# Patient Record
Sex: Female | Born: 1995 | Race: Black or African American | Hispanic: No | Marital: Single | Smoking: Current some day smoker
Health system: Southern US, Community
[De-identification: ages and names within clinical notes are randomized; demographics above are authoritative.]

## PROBLEM LIST (undated history)

## (undated) DIAGNOSIS — Z789 Other specified health status: Secondary | ICD-10-CM

## (undated) HISTORY — PX: NO PAST SURGERIES: SHX2092

---
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
## 2016-06-06 ENCOUNTER — Emergency Department (HOSPITAL_COMMUNITY): Payer: No Typology Code available for payment source

## 2016-06-06 ENCOUNTER — Emergency Department (HOSPITAL_COMMUNITY)
Admission: EM | Admit: 2016-06-06 | Discharge: 2016-06-06 | Disposition: A | Discharge status: Home / Self Care | Payer: No Typology Code available for payment source | Attending: Emergency Medicine | Admitting: Emergency Medicine

## 2016-06-06 ENCOUNTER — Encounter (HOSPITAL_COMMUNITY): Payer: Self-pay

## 2016-06-06 DIAGNOSIS — S8002XA Contusion of left knee, initial encounter: Secondary | ICD-10-CM | POA: Diagnosis not present

## 2016-06-06 DIAGNOSIS — S0083XA Contusion of other part of head, initial encounter: Secondary | ICD-10-CM | POA: Insufficient documentation

## 2016-06-06 DIAGNOSIS — F17200 Nicotine dependence, unspecified, uncomplicated: Secondary | ICD-10-CM | POA: Insufficient documentation

## 2016-06-06 DIAGNOSIS — S161XXA Strain of muscle, fascia and tendon at neck level, initial encounter: Secondary | ICD-10-CM

## 2016-06-06 DIAGNOSIS — Y999 Unspecified external cause status: Secondary | ICD-10-CM | POA: Diagnosis not present

## 2016-06-06 DIAGNOSIS — S0993XA Unspecified injury of face, initial encounter: Secondary | ICD-10-CM | POA: Diagnosis present

## 2016-06-06 DIAGNOSIS — Y939 Activity, unspecified: Secondary | ICD-10-CM | POA: Insufficient documentation

## 2016-06-06 DIAGNOSIS — Y92410 Unspecified street and highway as the place of occurrence of the external cause: Secondary | ICD-10-CM | POA: Diagnosis not present

## 2016-06-06 MED ORDER — NAPROXEN 500 MG PO TABS: 500.0000 mg | ORAL_TABLET | Freq: Two times a day (BID) | ORAL | 0 refills | Status: DC

## 2016-06-06 MED ORDER — ACETAMINOPHEN 325 MG PO TABS
650.0000 mg | ORAL_TABLET | Freq: Once | ORAL | Status: AC
  Administered 2016-06-06: 650 mg via ORAL
  Filled 2016-06-06: qty 2

## 2016-06-06 MED ORDER — METHOCARBAMOL 500 MG PO TABS: 500.0000 mg | ORAL_TABLET | Freq: Two times a day (BID) | ORAL | 0 refills | Status: DC

## 2016-06-06 NOTE — Discharge Instructions (Signed)
Ice your knee in your jaw several times a day. Naproxen for pain. Robaxin for muscle spasms. Follow-up with primary care doctor.
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
## 2017-09-25 ENCOUNTER — Encounter (HOSPITAL_COMMUNITY): Payer: Self-pay

## 2017-09-25 ENCOUNTER — Emergency Department (HOSPITAL_COMMUNITY)
Admission: EM | Admit: 2017-09-25 | Discharge: 2017-09-25 | Disposition: A | Discharge status: Home / Self Care | Payer: Medicaid Other | Attending: Emergency Medicine | Admitting: Emergency Medicine

## 2017-09-25 ENCOUNTER — Emergency Department (HOSPITAL_COMMUNITY)
Admission: EM | Admit: 2017-09-25 | Discharge: 2017-09-25 | Disposition: A | Discharge status: Home / Self Care | Payer: Medicaid Other | Source: Home / Self Care | Attending: Emergency Medicine | Admitting: Emergency Medicine

## 2017-09-25 ENCOUNTER — Emergency Department (HOSPITAL_COMMUNITY): Payer: Medicaid Other

## 2017-09-25 ENCOUNTER — Encounter (HOSPITAL_COMMUNITY): Payer: Self-pay | Admitting: Emergency Medicine

## 2017-09-25 DIAGNOSIS — F17210 Nicotine dependence, cigarettes, uncomplicated: Secondary | ICD-10-CM | POA: Diagnosis not present

## 2017-09-25 DIAGNOSIS — Y999 Unspecified external cause status: Secondary | ICD-10-CM | POA: Insufficient documentation

## 2017-09-25 DIAGNOSIS — Y939 Activity, unspecified: Secondary | ICD-10-CM | POA: Insufficient documentation

## 2017-09-25 DIAGNOSIS — S022XXD Fracture of nasal bones, subsequent encounter for fracture with routine healing: Secondary | ICD-10-CM

## 2017-09-25 DIAGNOSIS — T07XXXA Unspecified multiple injuries, initial encounter: Secondary | ICD-10-CM | POA: Diagnosis present

## 2017-09-25 DIAGNOSIS — H1132 Conjunctival hemorrhage, left eye: Secondary | ICD-10-CM | POA: Diagnosis not present

## 2017-09-25 DIAGNOSIS — S022XXA Fracture of nasal bones, initial encounter for closed fracture: Secondary | ICD-10-CM | POA: Diagnosis not present

## 2017-09-25 DIAGNOSIS — Y929 Unspecified place or not applicable: Secondary | ICD-10-CM | POA: Diagnosis not present

## 2017-09-25 DIAGNOSIS — S0003XA Contusion of scalp, initial encounter: Secondary | ICD-10-CM | POA: Diagnosis not present

## 2017-09-25 DIAGNOSIS — S1091XA Abrasion of unspecified part of neck, initial encounter: Secondary | ICD-10-CM | POA: Insufficient documentation

## 2017-09-25 DIAGNOSIS — S0012XA Contusion of left eyelid and periocular area, initial encounter: Secondary | ICD-10-CM | POA: Diagnosis not present

## 2017-09-25 LAB — POC URINE PREG, ED: Preg Test, Ur: NEGATIVE

## 2017-09-25 MED ORDER — KETOROLAC TROMETHAMINE 60 MG/2ML IM SOLN
60.0000 mg | Freq: Once | INTRAMUSCULAR | Status: AC
  Administered 2017-09-25: 60 mg via INTRAMUSCULAR
  Filled 2017-09-25: qty 2

## 2017-09-25 MED ORDER — IBUPROFEN 800 MG PO TABS
800.0000 mg | ORAL_TABLET | Freq: Once | ORAL | Status: AC
  Administered 2017-09-25: 800 mg via ORAL
  Filled 2017-09-25: qty 1

## 2017-09-25 MED ORDER — HYDROCODONE-ACETAMINOPHEN 5-325 MG PO TABS: 1.0000 | ORAL_TABLET | Freq: Four times a day (QID) | ORAL | 0 refills | Status: DC | PRN

## 2017-09-25 MED ORDER — IBUPROFEN 600 MG PO TABS: 600.0000 mg | ORAL_TABLET | Freq: Four times a day (QID) | ORAL | 0 refills | Status: DC | PRN
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
## 2017-09-25 NOTE — ED Triage Notes (Signed)
Patient was discharged within last hour. Patient c/o that ibuprofen that she was given is not helping with her facial pain and wants something stronger.
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
## 2018-02-05 ENCOUNTER — Ambulatory Visit: Payer: Medicaid Other | Admitting: Obstetrics

## 2018-02-18 ENCOUNTER — Other Ambulatory Visit (HOSPITAL_COMMUNITY): Payer: Self-pay | Admitting: Family

## 2018-03-27 ENCOUNTER — Other Ambulatory Visit (HOSPITAL_COMMUNITY): Payer: Self-pay | Admitting: Family

## 2018-04-16 ENCOUNTER — Ambulatory Visit: Payer: Medicaid Other | Admitting: Family Medicine

## 2018-06-23 ENCOUNTER — Ambulatory Visit (HOSPITAL_COMMUNITY)
Admission: EM | Admit: 2018-06-23 | Discharge: 2018-06-23 | Disposition: A | Discharge status: Home / Self Care | Payer: Self-pay | Attending: Family Medicine | Admitting: Family Medicine

## 2018-06-23 ENCOUNTER — Encounter (HOSPITAL_COMMUNITY): Payer: Self-pay | Admitting: Emergency Medicine

## 2018-06-23 ENCOUNTER — Ambulatory Visit (INDEPENDENT_AMBULATORY_CARE_PROVIDER_SITE_OTHER): Payer: Self-pay

## 2018-06-23 DIAGNOSIS — W228XXA Striking against or struck by other objects, initial encounter: Secondary | ICD-10-CM

## 2018-06-23 DIAGNOSIS — S62625A Displaced fracture of medial phalanx of left ring finger, initial encounter for closed fracture: Secondary | ICD-10-CM

## 2018-06-23 DIAGNOSIS — S62655A Nondisplaced fracture of medial phalanx of left ring finger, initial encounter for closed fracture: Secondary | ICD-10-CM

## 2018-06-23 NOTE — ED Provider Notes (Signed)
[facility]    CSN: [id number] Arrival date & time: 06/23/18  1900     History   Chief Complaint Chief Complaint  Patient presents with  . Finger Injury    HPI Brianna Donaldson is a 22 y.o. female. Pt hit finger; does not really provide more detail.  Has been swollen and painful since  HPI  History reviewed. No pertinent past medical history.  There are no active problems to display for this patient.   History reviewed. No pertinent surgical history.  OB History    Gravida  0   Para  0   Term  0   Preterm  0   AB  0   Living  0     SAB  0   TAB  0   Ectopic  0   Multiple  0   Live Births               Home Medications    Prior to Admission medications   Medication Sig Start Date End Date Taking? Authorizing Provider  HYDROcodone-acetaminophen (NORCO/VICODIN) 5-325 MG tablet Take 1 tablet by mouth every 6 (six) hours as needed for moderate pain. Patient not taking: Reported on 06/23/2018 09/25/17   Donnetta Hutching, MD  ibuprofen (ADVIL,MOTRIN) 600 MG tablet Take 1 tablet (600 mg total) by mouth every 6 (six) hours as needed for headache, mild pain or moderate pain. 09/25/17   Antony Madura, PA-C    Family History Family History  Problem Relation Age of Onset  . Diabetes Maternal Grandmother     Social History Social History   Tobacco Use  . Smoking status: Current Every Day Smoker  . Smokeless tobacco: Never Used  Substance Use Topics  . Alcohol use: No  . Drug use: Yes    Types: Marijuana     Allergies   Patient has no known allergies.   Review of Systems Review of Systems  Constitutional: Negative for chills and fever.  HENT: Negative for ear pain and sore throat.   Eyes: Negative for pain and visual disturbance.  Respiratory: Negative for cough and shortness of breath.   Cardiovascular: Negative for chest pain and palpitations.  Gastrointestinal: Negative for abdominal pain and vomiting.  Genitourinary: Negative  for dysuria and hematuria.  Musculoskeletal: Positive for arthralgias. Negative for back pain.  Skin: Negative for color change and rash.  Neurological: Negative for seizures and syncope.  All other systems reviewed and are negative.    Physical Exam Triage Vital Signs ED Triage Vitals  Enc Vitals Group     BP 06/23/18 2012 103/69     Pulse Rate 06/23/18 2012 83     Resp 06/23/18 2012 18     Temp 06/23/18 2012 98.4 F (36.9 C)     Temp Source 06/23/18 2012 Oral     SpO2 06/23/18 2012 100 %     Weight --      Height --      Head Circumference --      Peak Flow --      Pain Score 06/23/18 2013 7     Pain Loc --      Pain Edu? --      Excl. in GC? --    No data found.  Updated Vital Signs BP 103/69 (BP Location: Left Arm)   Pulse 83   Temp 98.4 F (36.9 C) (Oral)   Resp 18   SpO2 100%   Visual Acuity Right Eye Distance:  Left Eye Distance:   Bilateral Distance:    Right Eye Near:   Left Eye Near:    Bilateral Near:     Physical Exam  Constitutional: She appears well-developed and well-nourished.  Musculoskeletal:  l 4 th finger with swelling in mi-phalanx Tender to palpate  X ray shows non-displaced fracture  Vitals reviewed.    UC Treatments / Results  Labs (all labs ordered are listed, but only abnormal results are displayed) Labs Reviewed - No data to display  EKG None  Radiology No results found.  Procedures Procedures (including critical care time)  Medications Ordered in UC Medications - No data to display  Initial Impression / Assessment and Plan / UC Course  I have reviewed the triage vital signs and the nursing notes.  Pertinent labs & imaging results that were available during my care of the patient were reviewed by me and considered in my medical decision making (see chart for details).     Fracture. Finger.  Splint x 3-4 weeks and may buddy tape when splinting not available Final Clinical Impressions(s) / UC Diagnoses    Final diagnoses:  None   Discharge Instructions   None    ED Prescriptions    None     Controlled Substance Prescriptions [place] Controlled Substance Registry consulted? No   Frederica Kuster, MD 06/23/18 2036

## 2018-06-23 NOTE — ED Triage Notes (Signed)
Pt sts injured 4th digit on left hand 2 weeks ago and still having pain and swelling

## 2018-08-21 ENCOUNTER — Inpatient Hospital Stay (HOSPITAL_COMMUNITY)
Admission: AD | Admit: 2018-08-21 | Discharge: 2018-08-21 | Disposition: A | Discharge status: Home / Self Care | Payer: Medicaid Other | Source: Ambulatory Visit | Attending: Family Medicine | Admitting: Family Medicine

## 2018-08-21 ENCOUNTER — Inpatient Hospital Stay (HOSPITAL_COMMUNITY): Payer: Medicaid Other

## 2018-08-21 ENCOUNTER — Encounter (HOSPITAL_COMMUNITY): Payer: Self-pay | Admitting: *Deleted

## 2018-08-21 DIAGNOSIS — O209 Hemorrhage in early pregnancy, unspecified: Secondary | ICD-10-CM

## 2018-08-21 DIAGNOSIS — O3680X0 Pregnancy with inconclusive fetal viability, not applicable or unspecified: Secondary | ICD-10-CM

## 2018-08-21 DIAGNOSIS — O99331 Smoking (tobacco) complicating pregnancy, first trimester: Secondary | ICD-10-CM | POA: Insufficient documentation

## 2018-08-21 DIAGNOSIS — O208 Other hemorrhage in early pregnancy: Secondary | ICD-10-CM

## 2018-08-21 DIAGNOSIS — Z3A01 Less than 8 weeks gestation of pregnancy: Secondary | ICD-10-CM | POA: Diagnosis not present

## 2018-08-21 DIAGNOSIS — O26851 Spotting complicating pregnancy, first trimester: Secondary | ICD-10-CM | POA: Insufficient documentation

## 2018-08-21 HISTORY — DX: Other specified health status: Z78.9

## 2018-08-21 LAB — URINALYSIS, ROUTINE W REFLEX MICROSCOPIC
BILIRUBIN URINE: NEGATIVE
Glucose, UA: NEGATIVE mg/dL
Ketones, ur: NEGATIVE mg/dL
LEUKOCYTES UA: NEGATIVE
Nitrite: NEGATIVE
PROTEIN: NEGATIVE mg/dL
Specific Gravity, Urine: 1.009 (ref 1.005–1.030)
pH: 7 (ref 5.0–8.0)

## 2018-08-21 LAB — CBC
HCT: 38.8 % (ref 36.0–46.0)
HEMOGLOBIN: 12.8 g/dL (ref 12.0–15.0)
MCH: 24.8 pg — AB (ref 26.0–34.0)
MCHC: 33 g/dL (ref 30.0–36.0)
MCV: 75 fL — ABNORMAL LOW (ref 80.0–100.0)
Platelets: 391 10*3/uL (ref 150–400)
RBC: 5.17 MIL/uL — ABNORMAL HIGH (ref 3.87–5.11)
RDW: 16 % — ABNORMAL HIGH (ref 11.5–15.5)
WBC: 6.5 10*3/uL (ref 4.0–10.5)
nRBC: 0 % (ref 0.0–0.2)

## 2018-08-21 LAB — ABO/RH: ABO/RH(D): A POS

## 2018-08-21 LAB — POCT PREGNANCY, URINE: Preg Test, Ur: POSITIVE — AB

## 2018-08-21 LAB — HCG, QUANTITATIVE, PREGNANCY: hCG, Beta Chain, Quant, S: 39 m[IU]/mL — ABNORMAL HIGH (ref <5)

## 2018-08-21 NOTE — Discharge Instructions (Signed)
Vaginal Bleeding During Pregnancy, First Trimester    A small amount of bleeding (spotting) from the vagina is common during early pregnancy. Sometimes the bleeding is normal and does not cause problems. At other times, though, bleeding may be a sign of something serious. Tell your doctor about any bleeding from your vagina right away.  Follow these instructions at home:  Activity  · Follow your doctor's instructions about how active you can be.  · If needed, make plans for someone to help with your normal activities.  · Do not have sex or orgasms until your doctor says that this is safe.  General instructions  · Take over-the-counter and prescription medicines only as told by your doctor.  · Watch your condition for any changes.  · Write down:  ? The number of pads you use each day.  ? How often you change pads.  ? How soaked (saturated) your pads are.  · Do not use tampons.  · Do not douche.  · If you pass any tissue from your vagina, save it to show to your doctor.  · Keep all follow-up visits as told by your doctor. This is important.  Contact a doctor if:  · You have vaginal bleeding at any time while you are pregnant.  · You have cramps.  · You have a fever.  Get help right away if:  · You have very bad cramps in your back or belly (abdomen).  · You pass large clots or a lot of tissue from your vagina.  · Your bleeding gets worse.  · You feel light-headed.  · You feel weak.  · You pass out (faint).  · You have chills.  · You are leaking fluid from your vagina.  · You have a gush of fluid from your vagina.  Summary  · Sometimes vaginal bleeding during pregnancy is normal and does not cause problems. At other times, bleeding may be a sign of something serious.  · Tell your doctor about any bleeding from your vagina right away.  · Follow your doctor's instructions about how active you can be. You may need someone to help you with your normal activities.  This information is not intended to replace advice given to  you by your health care provider. Make sure you discuss any questions you have with your health care provider.  Document Released: 12/14/2013 Document Revised: 10/31/2016 Document Reviewed: 10/31/2016  Elsevier Interactive Patient Education © 2019 [employer]

## 2018-08-21 NOTE — MAU Provider Note (Signed)
History     CSN: [id number]  Arrival date and time: 08/21/18 1630   Chief Complaint  Patient presents with  . Possible Pregnancy  . Abdominal Pain  . Vaginal Bleeding   HPI Brianna Donaldson is a 23 y.o. G1P0000 at [redacted]w[redacted]d who presents with spotting for the last 2 days. She denies any pain. No recent intercourse. She denies any vaginal discharge. She reports a positive pregnancy test at home this week. LMP sometime in November  OB History    Gravida  1   Para  0   Term  0   Preterm  0   AB  0   Living  0     SAB  0   TAB  0   Ectopic  0   Multiple  0   Live Births              Past Medical History:  Diagnosis Date  . Medical history non-contributory     Past Surgical History:  Procedure Laterality Date  . NO PAST SURGERIES      Family History  Problem Relation Age of Onset  . Diabetes Maternal Grandmother     Social History   Tobacco Use  . Smoking status: Current Some Day Smoker  . Smokeless tobacco: Never Used  Substance Use Topics  . Alcohol use: No  . Drug use: Yes    Types: Marijuana    Comment: cutting back since she found out she is pregnant    Allergies: No Known Allergies  Medications Prior to Admission  Medication Sig Dispense Refill Last Dose  . HYDROcodone-acetaminophen (NORCO/VICODIN) 5-325 MG tablet Take 1 tablet by mouth every 6 (six) hours as needed for moderate pain. (Patient not taking: Reported on 06/23/2018) 15 tablet 0 Not Taking at Unknown time  . ibuprofen (ADVIL,MOTRIN) 600 MG tablet Take 1 tablet (600 mg total) by mouth every 6 (six) hours as needed for headache, mild pain or moderate pain. 15 tablet 0 More than a month at Unknown time    Review of Systems  Constitutional: Negative.  Negative for fatigue and fever.  HENT: Negative.   Respiratory: Negative.  Negative for shortness of breath.   Cardiovascular: Negative.  Negative for chest pain.  Gastrointestinal: Negative.  Negative for abdominal pain,  constipation, diarrhea, nausea and vomiting.  Genitourinary: Positive for vaginal bleeding. Negative for dysuria and vaginal discharge.  Neurological: Negative.  Negative for dizziness and headaches.   Physical Exam   Blood pressure 131/80, pulse (!) 103, temperature 98.3 F (36.8 C), temperature source Oral, resp. rate 18, height 5\' 4"  (1.626 m), weight 53.1 kg, last menstrual period 07/09/2018, SpO2 100 %.  Physical Exam  Nursing note and vitals reviewed. Constitutional: She is oriented to person, place, and time. She appears well-developed and well-nourished. No distress.  HENT:  Head: Normocephalic.  Eyes: Pupils are equal, round, and reactive to light.  Cardiovascular: Normal rate, regular rhythm and normal heart sounds.  Respiratory: Effort normal and breath sounds normal. No respiratory distress.  GI: Soft. Bowel sounds are normal. She exhibits no distension. There is no abdominal tenderness.  Neurological: She is alert and oriented to person, place, and time.  Skin: Skin is warm and dry.  Psychiatric: She has a normal mood and affect. Her behavior is normal. Judgment and thought content normal.    MAU Course  Procedures Results for orders placed or performed during the hospital encounter of 08/21/18 (from the past 24 hour(s))  Urinalysis, Routine w  reflex microscopic     Status: Abnormal   Collection Time: 08/21/18  6:04 PM  Result Value Ref Range   Color, Urine YELLOW YELLOW   APPearance CLEAR CLEAR   Specific Gravity, Urine 1.009 1.005 - 1.030   pH 7.0 5.0 - 8.0   Glucose, UA NEGATIVE NEGATIVE mg/dL   Hgb urine dipstick MODERATE (A) NEGATIVE   Bilirubin Urine NEGATIVE NEGATIVE   Ketones, ur NEGATIVE NEGATIVE mg/dL   Protein, ur NEGATIVE NEGATIVE mg/dL   Nitrite NEGATIVE NEGATIVE   Leukocytes, UA NEGATIVE NEGATIVE   RBC / HPF 0-5 0 - 5 RBC/hpf   WBC, UA 0-5 0 - 5 WBC/hpf   Bacteria, UA FEW (A) NONE SEEN   Squamous Epithelial / LPF 0-5 0 - 5   Mucus PRESENT    Pregnancy, urine POC     Status: Abnormal   Collection Time: 08/21/18  6:10 PM  Result Value Ref Range   Preg Test, Ur POSITIVE (A) NEGATIVE  CBC     Status: Abnormal   Collection Time: 08/21/18  7:23 PM  Result Value Ref Range   WBC 6.5 4.0 - 10.5 K/uL   RBC 5.17 (H) 3.87 - 5.11 MIL/uL   Hemoglobin 12.8 12.0 - 15.0 g/dL   HCT 04.5 40.9 - 81.1 %   MCV 75.0 (L) 80.0 - 100.0 fL   MCH 24.8 (L) 26.0 - 34.0 pg   MCHC 33.0 30.0 - 36.0 g/dL   RDW 91.4 (H) 78.2 - 95.6 %   Platelets 391 150 - 400 K/uL   nRBC 0.0 0.0 - 0.2 %  hCG, quantitative, pregnancy     Status: Abnormal   Collection Time: 08/21/18  7:23 PM  Result Value Ref Range   hCG, Beta Chain, Quant, S 39 (H) <5 mIU/mL  ABO/Rh     Status: None (Preliminary result)   Collection Time: 08/21/18  7:23 PM  Result Value Ref Range   ABO/RH(D)      A POS Performed at [facility], [address], [place], [place] [address]    US Ob Less Than 14 Weeks With Ob Transvaginal  Result Date: 08/21/2018 CLINICAL DATA:  Two-day history of spotting. Positive pregnancy test. EXAM: OBSTETRIC <14 WK [place] AND TRANSVAGINAL OB US TECHNIQUE: Both transabdominal and transvaginal ultrasound examinations were performed for complete evaluation of the gestation as well as the maternal uterus, adnexal regions, and pelvic cul-de-sac. Transvaginal technique was performed to assess early pregnancy. COMPARISON:  None. FINDINGS: Intrauterine gestational sac: Small cystic structure identified low in the endometrial canal, in the region of the internal cervical os. This appears to be in the endometrium/endometrial canal and not in the cervical parenchyma. There is no yolk sac or embryo visible within this structure. MSD: 4.9 mm   5 w   1 d Subchorionic hemorrhage:  None visualized. Maternal uterus/adnexae: Maternal ovaries unremarkable. No adnexal mass. No intraperitoneal free fluid. IMPRESSION: Small cystic structure identified low in the uterus, essentially at the  internal cervical os. This appears to be in the endometrial canal rather than the cervical stroma (is would be expected for nabothian cyst) and gestational sac in the lower uterine segment is a consideration. Without visible yolk sac/embryo, gestational sac can not be confirmed. No adnexal mass or free fluid. Close follow-up recommended. Electronically Signed   By: Kennith Center M.D.   On: 08/21/2018 19:21   MDM UA, UPT CBC, HCG ABO/Rh- A Pos Wet prep and gc/chlamydia- refused US OB Comp Less 14 weeks with Transvaginal  Assessment and Plan   1. Pregnancy of unknown anatomic location   2. Vaginal bleeding affecting early pregnancy    -Discharge home in stable condition -Strict ectopic precautions discussed -Patient advised to follow-up with MAU on Saturday for repeat HCG -Patient may return to MAU as needed or if her condition were to change or worsen   Rolm BookbinderCaroline M Jaleiyah Alas CNM 08/21/2018, 8:22 PM

## 2018-08-21 NOTE — MAU Note (Signed)
Pt reports some spotting for the last 2 days, cramping.

## 2018-08-25 ENCOUNTER — Inpatient Hospital Stay (HOSPITAL_COMMUNITY)
Admission: AD | Admit: 2018-08-25 | Discharge: 2018-08-25 | Disposition: A | Discharge status: Home / Self Care | Payer: Medicaid Other | Attending: Obstetrics and Gynecology | Admitting: Obstetrics and Gynecology

## 2018-08-25 DIAGNOSIS — Z3A01 Less than 8 weeks gestation of pregnancy: Secondary | ICD-10-CM | POA: Insufficient documentation

## 2018-08-25 DIAGNOSIS — O26891 Other specified pregnancy related conditions, first trimester: Secondary | ICD-10-CM | POA: Insufficient documentation

## 2018-08-25 DIAGNOSIS — O039 Complete or unspecified spontaneous abortion without complication: Secondary | ICD-10-CM

## 2018-08-25 LAB — HCG, QUANTITATIVE, PREGNANCY: hCG, Beta Chain, Quant, S: 6 m[IU]/mL — ABNORMAL HIGH (ref <5)

## 2018-08-25 NOTE — MAU Note (Signed)
Pt was to return Saturday for repeat Hcg but didn't stay because it looked like a long wait. Here today for labs. No increasing pain and bleeding has slowed down.

## 2018-08-25 NOTE — Discharge Instructions (Signed)
Miscarriage  A miscarriage is the loss of an unborn baby (fetus) before the 20th week of pregnancy. Most miscarriages happen during the first 3 months of pregnancy. Sometimes, a miscarriage can happen before a woman knows that she is pregnant.  Having a miscarriage can be an emotional experience. If you have had a miscarriage, talk with your health care provider about any questions you may have about miscarrying, the grieving process, and your plans for future pregnancy.  What are the causes?  A miscarriage may be caused by:  · Problems with the genes or chromosomes of the fetus. These problems make it impossible for the baby to develop normally. They are often the result of random errors that occur early in the development of the baby, and are not passed from parent to child (not inherited).  · Infection of the cervix or uterus.  · Conditions that affect hormone balance in the body.  · Problems with the cervix, such as the cervix opening and thinning before pregnancy is at term (cervical insufficiency).  · Problems with the uterus. These may include:  ? A uterus with an abnormal shape.  ? Fibroids in the uterus.  ? Congenital abnormalities. These are problems that were present at birth.  · Certain medical conditions.  · Smoking, drinking alcohol, or using drugs.  · Injury (trauma).  In many cases, the cause of a miscarriage is not known.  What are the signs or symptoms?  Symptoms of this condition include:  · Vaginal bleeding or spotting, with or without cramps or pain.  · Pain or cramping in the abdomen or lower back.  · Passing fluid, tissue, or blood clots from the vagina.  How is this diagnosed?  This condition may be diagnosed based on:  · A physical exam.  · Ultrasound.  · Blood tests.  · Urine tests.  How is this treated?  Treatment for a miscarriage is sometimes not necessary if you naturally pass all the tissue that was in your uterus. If necessary, this condition may be treated with:  · Dilation and  curettage (D&C). This is a procedure in which the cervix is stretched open and the lining of the uterus (endometrium) is scraped. This is done only if tissue from the fetus or placenta remains in the body (incomplete miscarriage).  · Medicines, such as:  ? Antibiotic medicine, to treat infection.  ? Medicine to help the body pass any remaining tissue.  ? Medicine to reduce (contract) the size of the uterus. These medicines may be given if you have a lot of bleeding.  If you have Rh negative blood and your baby was Rh positive, you will need a shot of a medicine called Rh immunoglobulinto protect your future babies from Rh blood problems. "Rh-negative" and "Rh-positive" refer to whether or not the blood has a specific protein found on the surface of red blood cells (Rh factor).  Follow these instructions at home:  Medicines    · Take over-the-counter and prescription medicines only as told by your health care provider.  · If you were prescribed antibiotic medicine, take it as told by your health care provider. Do not stop taking the antibiotic even if you start to feel better.  · Do not take NSAIDs, such as aspirin and ibuprofen, unless they are approved by your health care provider. These medicines can cause bleeding.  Activity  · Rest as directed. Ask your health care provider what activities are safe for you.  · Have someone   help with home and family responsibilities during this time.  General instructions  · Keep track of the number of sanitary pads you use each day and how soaked (saturated) they are. Write down this information.  · Monitor the amount of tissue or blood clots that you pass from your vagina. Save any large amounts of tissue for your health care provider to examine.  · Do not use tampons, douche, or have sex until your health care provider approves.  · To help you and your partner with the process of grieving, talk with your health care provider or seek counseling.  · When you are ready, meet with  your health care provider to discuss any important steps you should take for your health. Also, discuss steps you should take to have a healthy pregnancy in the future.  · Keep all follow-up visits as told by your health care provider. This is important.  Where to find more information  · The American Congress of Obstetricians and Gynecologists: www.acog.org  · U.[place]. Department of Health and Human Services Office of Women’s Health: www.womenshealth.gov  Contact a health care provider if:  · You have a fever or chills.  · You have a foul smelling vaginal discharge.  · You have more bleeding instead of less.  Get help right away if:  · You have severe cramps or pain in your back or abdomen.  · You pass blood clots or tissue from your vagina that is walnut-sized or larger.  · You soak more than 1 regular sanitary pad in an hour.  · You become light-headed or weak.  · You pass out.  · You have feelings of sadness that take over your thoughts, or you have thoughts of hurting yourself.  Summary  · Most miscarriages happen in the first 3 months of pregnancy. Sometimes miscarriage happens before a woman even knows that she is pregnant.  · Follow your health care provider's instruction for home care. Keep all follow-up appointments.  · To help you and your partner with the process of grieving, talk with your health care provider or seek counseling.  This information is not intended to replace advice given to you by your health care provider. Make sure you discuss any questions you have with your health care provider.  Document Released: 01/23/2001 Document Revised: 09/04/2016 Document Reviewed: 09/04/2016  Elsevier Interactive Patient Education © 2019 [employer]vier Inc.

## 2018-08-25 NOTE — MAU Provider Note (Signed)
Ms. Brianna Donaldson  is a 23 y.o. G1P0000  at [redacted]w[redacted]d who presents to MAU today for follow-up quant hCG. The patient denies abdominal pain, vaginal bleeding, N/V or fever.   BP 125/81 (BP Location: Right Arm)   Pulse 100   Temp 98.2 F (36.8 C) (Oral)   Resp 16   Wt 52.2 kg   LMP 07/09/2018   SpO2 100%   BMI 19.74 kg/m   GENERAL: Well-developed, well-nourished female in no acute distress.  HEENT: Normocephalic, atraumatic.   LUNGS: Effort normal HEART: Regular rate  SKIN: Warm, dry and without erythema PSYCH: Normal mood and affect   A: Drop in HCG consistent with miscarriage Lab Results  Component Value Date   HCGBETAQNT 6 (H) 08/25/2018   HCGBETAQNT 39 (H) 08/21/2018     P: Discharge home Msg to [facility] for SAB f/u appt Discussed reasons to return to MAU   Judeth Horn, NP  08/25/2018 8:26 PM

## 2018-09-10 ENCOUNTER — Ambulatory Visit: Payer: Medicaid Other | Admitting: Obstetrics and Gynecology

## 2018-09-25 ENCOUNTER — Encounter: Payer: Medicaid Other | Admitting: Certified Nurse Midwife

## 2019-05-18 ENCOUNTER — Inpatient Hospital Stay (HOSPITAL_COMMUNITY)
Admission: AD | Admit: 2019-05-18 | Discharge: 2019-05-18 | Disposition: A | Discharge status: Home / Self Care | Payer: Medicaid Other | Attending: Obstetrics & Gynecology | Admitting: Obstetrics & Gynecology

## 2019-05-18 ENCOUNTER — Other Ambulatory Visit: Payer: Self-pay

## 2019-05-18 ENCOUNTER — Encounter (HOSPITAL_COMMUNITY): Payer: Self-pay | Admitting: Student

## 2019-05-18 DIAGNOSIS — Z3201 Encounter for pregnancy test, result positive: Secondary | ICD-10-CM | POA: Insufficient documentation

## 2019-05-18 NOTE — MAU Provider Note (Signed)
First Provider Initiated Contact with Patient 05/18/19 1930      S Ms. Brianna Donaldson is a 23 y.o. G56P0010 female who presents to MAU today with complaint of "wanting to know if I'm pregnant." Pt reports she took three pregnancy tests at home all with faint positive lines, but reports she does not want to be pregnant at this time and wants to make sure she is. Pt denies pain, bleeding, nausea, vomiting or other problems. Pt reports she feels overall well.   O BP 119/84 (BP Location: Right Arm)   Pulse 91   Temp 98.5 F (36.9 C) (Oral)   Resp 18   Wt 50.8 kg   LMP 04/16/2019 (Approximate)   Breastfeeding Unknown   BMI 19.24 kg/m    Patient Vitals for the past 24 hrs:  BP Temp Temp src Pulse Resp Weight  05/18/19 1925 119/84 98.5 F (36.9 C) Oral 91 18 50.8 kg   Physical Exam  Constitutional: She is oriented to person, place, and time. She appears well-developed and well-nourished. No distress.  HENT:  Head: Normocephalic and atraumatic.  Respiratory: Effort normal.  Neurological: She is alert and oriented to person, place, and time.  Skin: She is not diaphoretic.  Psychiatric: She has a normal mood and affect. Her behavior is normal. Judgment and thought content normal.   A Medical screening exam complete  P Discharge from MAU in stable condition Patient given the option of transfer to [facility] for further evaluation or seek care in outpatient facility of choice Pt declines list of follow-up options, stating that she has her own PCP who will refer her to OB/GYN, if necessary Warning signs for worsening condition that would warrant emergency follow-up discussed Patient may return to MAU as needed for pregnancy related complaints  Nugent, Gerrie Nordmann, NP 05/18/2019 7:51 PM

## 2019-05-18 NOTE — MAU Note (Signed)
Pt reports to MAU c/o having a faint + UPT that she took today. Pt states it looked almost negative unless you look close. Pt denies any pain or bleeding. Pt reports her LMP was September 3rd. Pt states she has only had loose stools but states she has increased her water intake recently and she is unsure if it is that. Pt denies any problems at this time and she is just worried because her last pregnancy was a miscarriage early.

## 2019-06-05 ENCOUNTER — Ambulatory Visit (HOSPITAL_COMMUNITY)
Admission: EM | Admit: 2019-06-05 | Discharge: 2019-06-05 | Disposition: A | Discharge status: Home / Self Care | Payer: Medicaid Other | Attending: Emergency Medicine | Admitting: Emergency Medicine

## 2019-06-05 ENCOUNTER — Other Ambulatory Visit: Payer: Self-pay

## 2019-06-05 ENCOUNTER — Encounter (HOSPITAL_COMMUNITY): Payer: Self-pay

## 2019-06-05 DIAGNOSIS — K0889 Other specified disorders of teeth and supporting structures: Secondary | ICD-10-CM | POA: Diagnosis not present

## 2019-06-05 MED ORDER — CLINDAMYCIN HCL 300 MG PO CAPS: 300.0000 mg | ORAL_CAPSULE | Freq: Three times a day (TID) | ORAL | 0 refills | Status: DC

## 2019-06-05 NOTE — Discharge Instructions (Signed)
Take the antibiotic clindamycin as directed.    A dental resource guide is attached.  Please call to make an appointment with a dentist as soon as possible.    Return here or go to the emergency department if you develop increased pain, swelling, fever, chills, or other concerning symptoms.

## 2019-06-05 NOTE — ED Provider Notes (Addendum)
[facility]    CSN: [id number] Arrival date & time: 06/05/19  1400      History   Chief Complaint Chief Complaint  Patient presents with  . Dental Pain  . Jaw Pain  . Headache    HPI Brianna Donaldson is a 23 y.o. female.   Patient presents with tooth abscess on her right lower rear molar x1 day.  She states she attempted treatment at home by trying to rupture the abscess yesterday without success.  She reports associated headache.  She denies fever, chills, difficulty swallowing, shortness of breath, or other symptoms.  She reports previous dental infections have not responded to amoxicillin.  No pertinent medical history.  LMP: current.   The history is provided by the patient.    Past Medical History:  Diagnosis Date  . Medical history non-contributory     There are no active problems to display for this patient.   Past Surgical History:  Procedure Laterality Date  . NO PAST SURGERIES      OB History    Gravida  1   Para  0   Term  0   Preterm  0   AB  1   Living  0     SAB  1   TAB  0   Ectopic  0   Multiple  0   Live Births               Home Medications    Prior to Admission medications   Medication Sig Start Date End Date Taking? Authorizing Provider  clindamycin (CLEOCIN) 300 MG capsule Take 1 capsule (300 mg total) by mouth 3 (three) times daily. 06/05/19   Sharion Balloon, NP    Family History Family History  Problem Relation Age of Onset  . Diabetes Maternal Grandmother     Social History Social History   Tobacco Use  . Smoking status: Current Some Day Smoker  . Smokeless tobacco: Never Used  Substance Use Topics  . Alcohol use: No  . Drug use: Yes    Types: Marijuana    Comment: cutting back since she found out she is pregnant     Allergies   Patient has no known allergies.   Review of Systems Review of Systems  Constitutional: Negative for chills and fever.  HENT: Positive for dental problem.  Negative for ear pain, sore throat and trouble swallowing.   Eyes: Negative for pain and visual disturbance.  Respiratory: Negative for cough and shortness of breath.   Cardiovascular: Negative for chest pain and palpitations.  Gastrointestinal: Negative for abdominal pain and vomiting.  Genitourinary: Negative for dysuria and hematuria.  Musculoskeletal: Negative for arthralgias and back pain.  Skin: Negative for color change and rash.  Neurological: Negative for seizures and syncope.  All other systems reviewed and are negative.    Physical Exam Triage Vital Signs ED Triage Vitals  Enc Vitals Group     BP      Pulse      Resp      Temp      Temp src      SpO2      Weight      Height      Head Circumference      Peak Flow      Pain Score      Pain Loc      Pain Edu?      Excl. in [place]?    No data  found.  Updated Vital Signs BP 122/78 (BP Location: Right Arm)   Pulse 84   Temp 98.5 F (36.9 C) (Oral)   Resp 15   SpO2 100%   Visual Acuity Right Eye Distance:   Left Eye Distance:   Bilateral Distance:    Right Eye Near:   Left Eye Near:    Bilateral Near:     Physical Exam Vitals signs and nursing note reviewed.  Constitutional:      General: She is not in acute distress.    Appearance: She is well-developed. She is not ill-appearing.  HENT:     Head: Normocephalic and atraumatic.     Right Ear: Tympanic membrane normal.     Left Ear: Tympanic membrane normal.     Nose: Nose normal.     Mouth/Throat:     Mouth: Mucous membranes are moist.     Dentition: Dental caries present.     Pharynx: Oropharynx is clear.      Comments: Numerous dental caries throughout mouth.    Eyes:     Conjunctiva/sclera: Conjunctivae normal.  Neck:     Musculoskeletal: Neck supple.  Cardiovascular:     Rate and Rhythm: Normal rate and regular rhythm.     Heart sounds: No murmur.  Pulmonary:     Effort: Pulmonary effort is normal. No respiratory distress.     Breath  sounds: Normal breath sounds.  Abdominal:     Palpations: Abdomen is soft.     Tenderness: There is no abdominal tenderness. There is no guarding or rebound.  Skin:    General: Skin is warm and dry.  Neurological:     General: No focal deficit present.     Mental Status: She is alert and oriented to person, place, and time.      UC Treatments / Results  Labs (all labs ordered are listed, but only abnormal results are displayed) Labs Reviewed - No data to display  EKG   Radiology No results found.  Procedures Procedures (including critical care time)  Medications Ordered in UC Medications - No data to display  Initial Impression / Assessment and Plan / UC Course  I have reviewed the triage vital signs and the nursing notes.  Pertinent labs & imaging results that were available during my care of the patient were reviewed by me and considered in my medical decision making (see chart for details).    Tooth ache.  Treating with clindamycin.  Instructed patient to take Tylenol or ibuprofen as needed for discomfort.  Instructed her to follow-up with a dentist as soon as possible.  Dental resource guide given.  Instructed patient to go to the emergency department if she has increased pain, swelling, fever, chills, or other concerning symptoms.  Patient agrees with plan of care.     Final Clinical Impressions(s) / UC Diagnoses   Final diagnoses:  Toothache     Discharge Instructions     Take the antibiotic clindamycin as directed.    A dental resource guide is attached.  Please call to make an appointment with a dentist as soon as possible.    Return here or go to the emergency department if you develop increased pain, swelling, fever, chills, or other concerning symptoms.        ED Prescriptions    Medication Sig Dispense Auth. Provider   clindamycin (CLEOCIN) 300 MG capsule Take 1 capsule (300 mg total) by mouth 3 (three) times daily. 21 capsule Mickie Bail, NP  I have reviewed the PDMP during this encounter.   Mickie Bailate, Rheta Hemmelgarn H, NP 06/05/19 1504    Mickie Bailate, Delvina Mizzell H, NP 06/05/19 [phone]

## 2019-06-05 NOTE — ED Triage Notes (Signed)
Pt states she has and abscess in her gum and she tried to popped last night. Pt states she is having right sided jaw pain, dental pain and headache x 1 day. Pt states bottom lip is numbness x 1 day.

## 2020-04-02 IMAGING — DX DG FINGER RING 2+V*L*
3 series · 3 of 3 positions shown · non-contrast
Comparison: None.

CLINICAL DATA: Injury to the ring finger

EXAM:
LEFT RING FINGER 2+V

[finger ap]
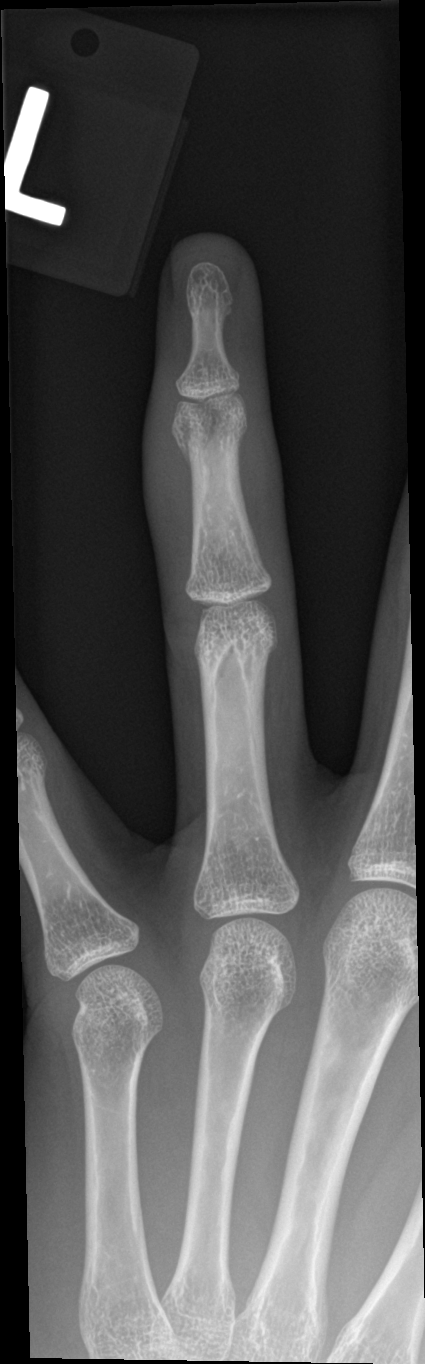

[finger obl]
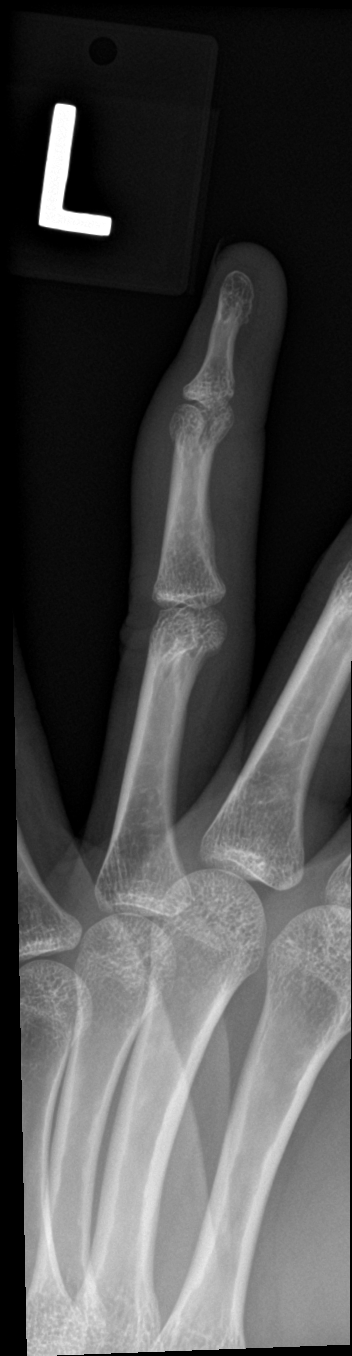

[finger lat]
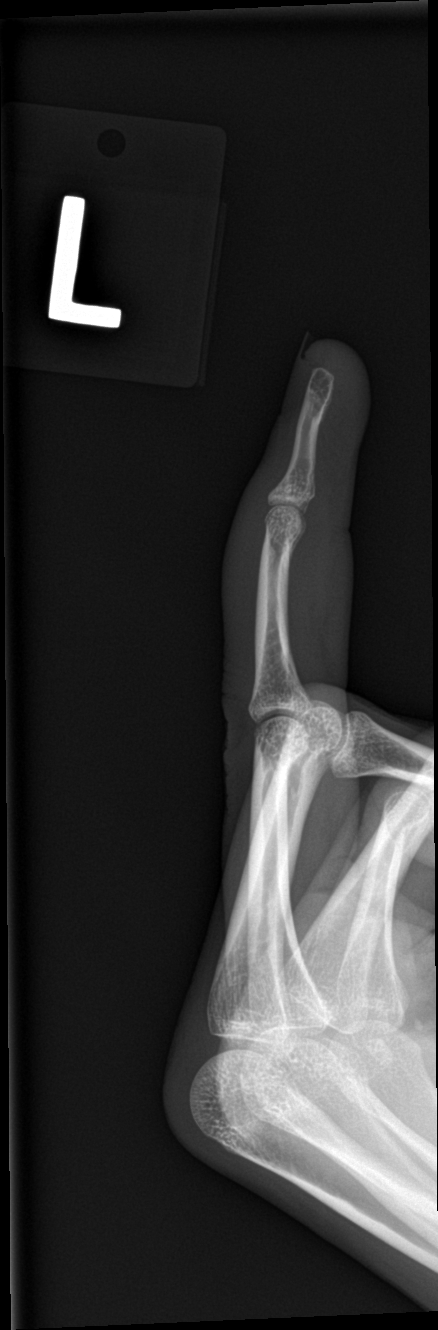

[3 of 3 positions shown; findings below may reference images not displayed]

FINDINGS: Subacute mildly displaced intra-articular fracture head of the
fourth middle phalanx. No subluxation. No radiopaque foreign body.
IMPRESSION: Subacute mildly displaced intra-articular fracture involving the
head of the fourth middle phalanx

## 2020-05-20 ENCOUNTER — Emergency Department (HOSPITAL_COMMUNITY)
Admission: EM | Admit: 2020-05-20 | Discharge: 2020-05-21 | Disposition: A | Discharge status: Home / Self Care | Payer: Medicaid Other | Attending: Emergency Medicine | Admitting: Emergency Medicine

## 2020-05-20 ENCOUNTER — Other Ambulatory Visit: Payer: Self-pay

## 2020-05-20 ENCOUNTER — Encounter (HOSPITAL_COMMUNITY): Payer: Self-pay

## 2020-05-20 DIAGNOSIS — Z5321 Procedure and treatment not carried out due to patient leaving prior to being seen by health care provider: Secondary | ICD-10-CM | POA: Insufficient documentation

## 2020-05-20 DIAGNOSIS — M542 Cervicalgia: Secondary | ICD-10-CM | POA: Insufficient documentation
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
## 2020-05-31 IMAGING — US US OB < 14 WEEKS - US OB TV
1 series · 15 of 28 positions shown · non-contrast
Comparison: None.

CLINICAL DATA: Two-day history of spotting. Positive pregnancy
test.

EXAM:
OBSTETRIC <14 WK US AND TRANSVAGINAL OB US
TECHNIQUE: Both transabdominal and transvaginal ultrasound examinations were
performed for complete evaluation of the gestation as well as the
maternal uterus, adnexal regions, and pelvic cul-de-sac.
Transvaginal technique was performed to assess early pregnancy.

[Series 1: us ob < 14 weeks - us ob tv · 15 of 69 slices shown]
[im 1/69]
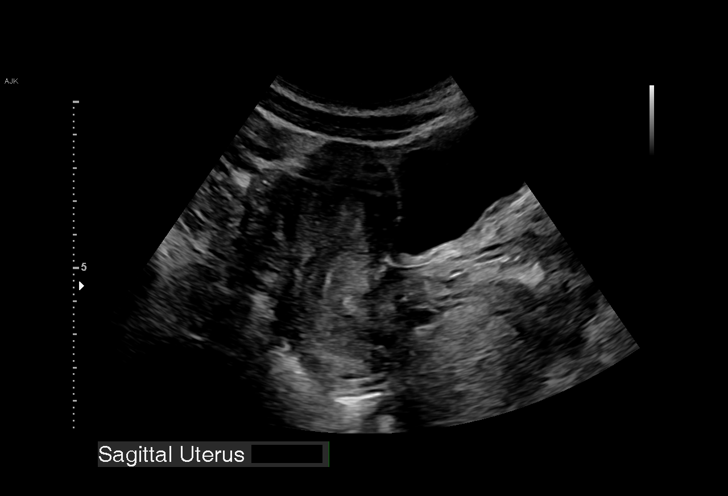
[im 6/69]
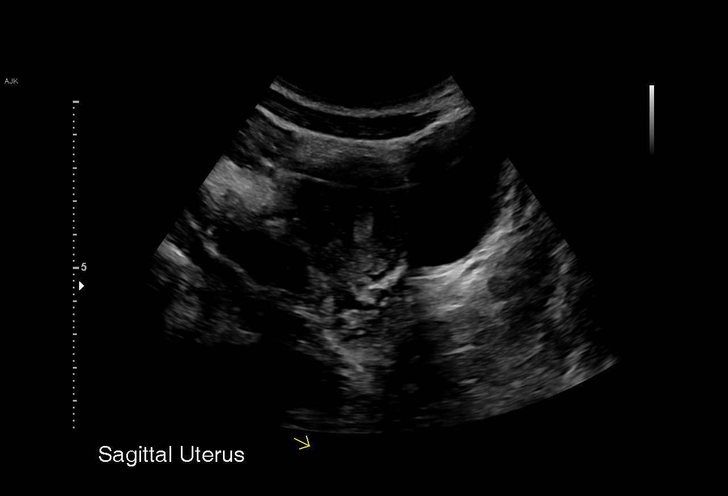
[im 11/69]
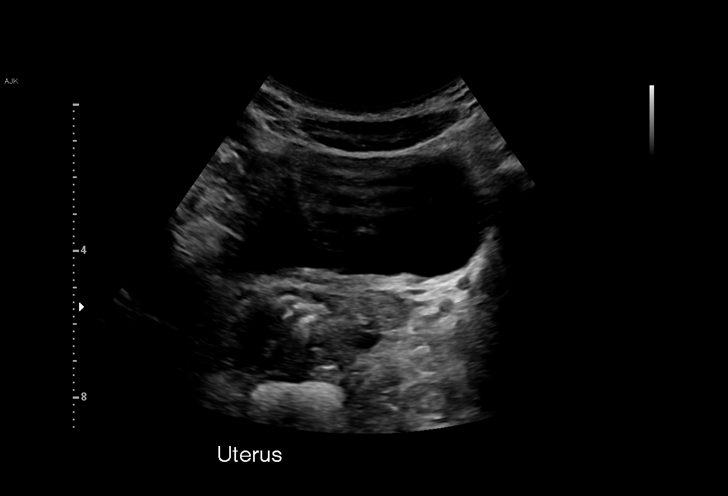
[im 16/69]
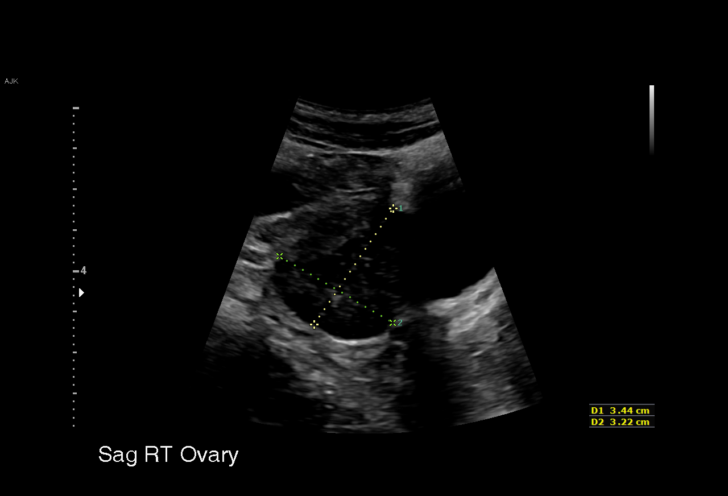
[im 21/69]
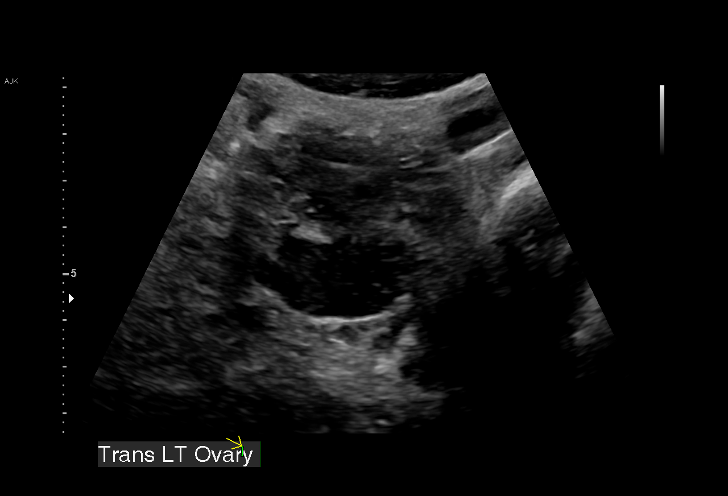
[im 26/69]
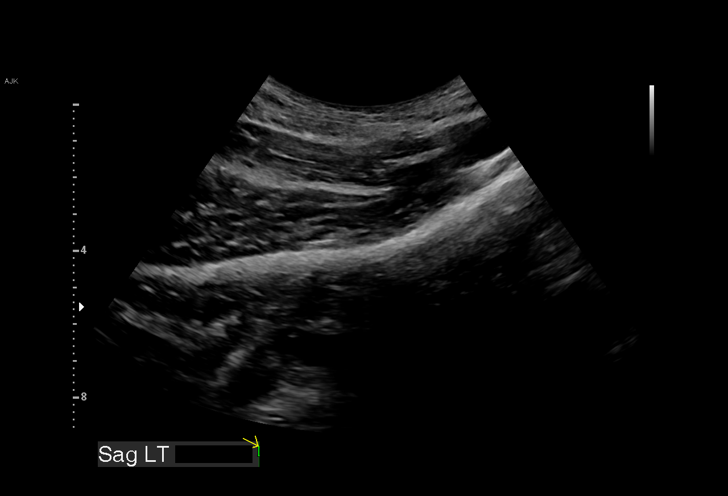
[im 31/69]
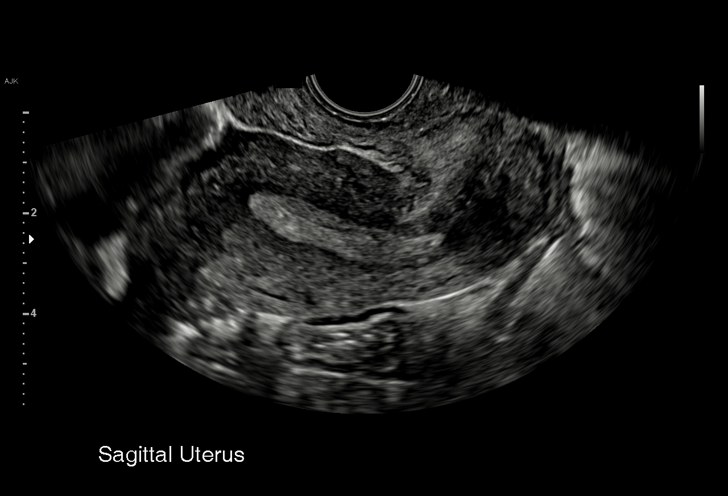
[im 36/69]
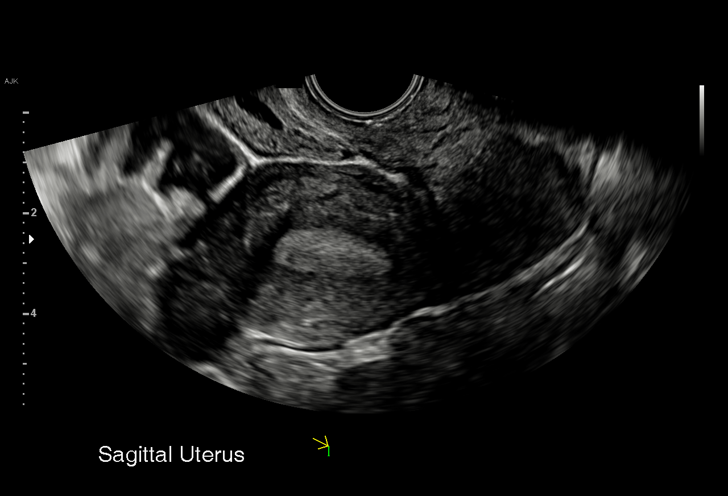
[im 38/69]
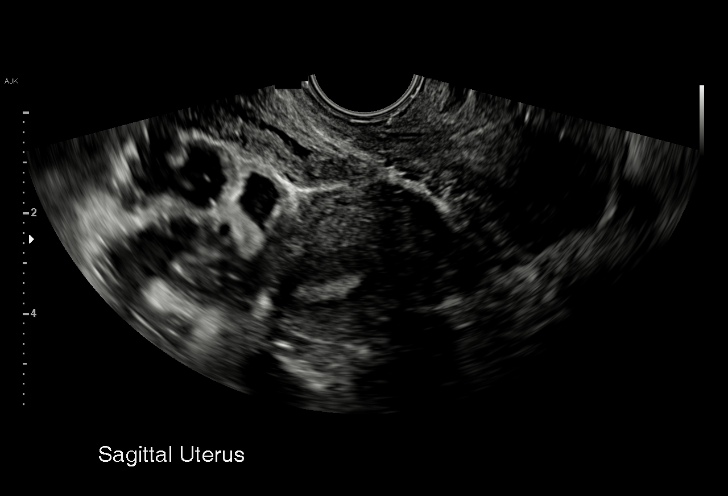
[im 43/69]
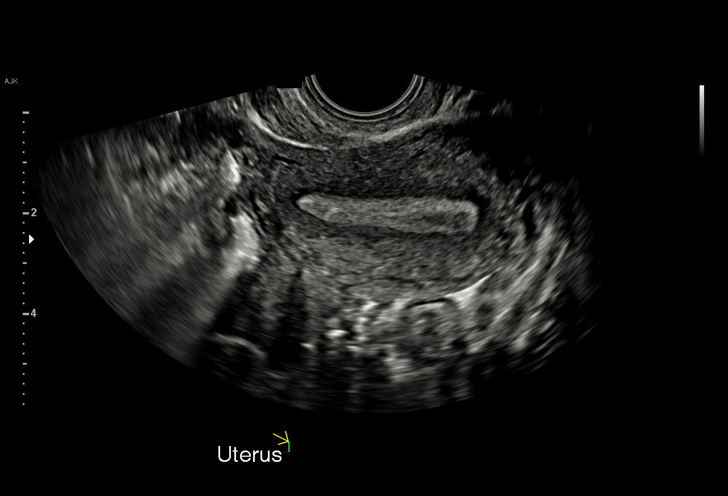
[im 48/69]
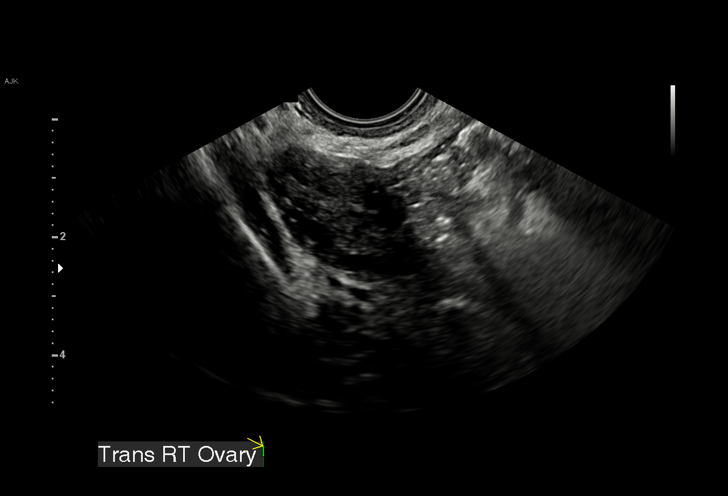
[im 53/69]
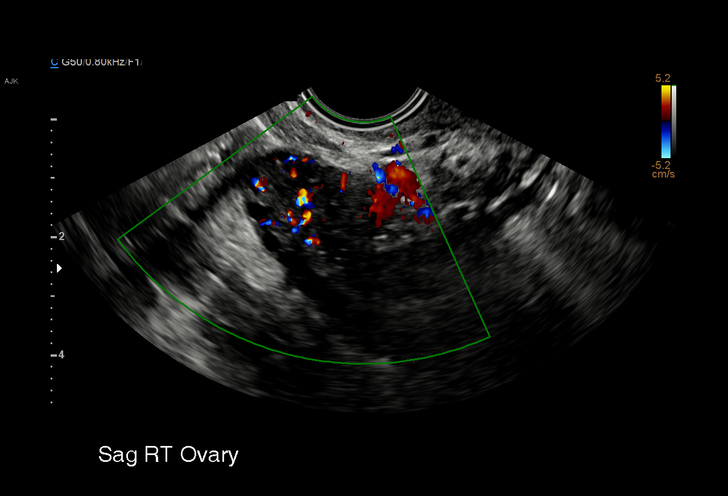
[im 58/69]
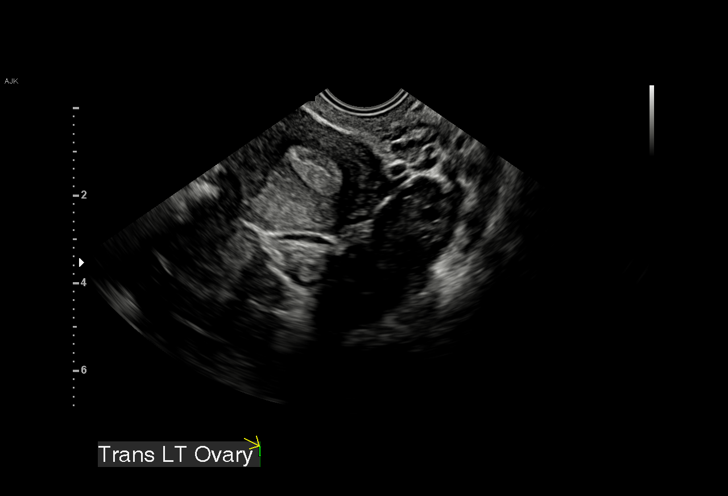
[im 63/69]
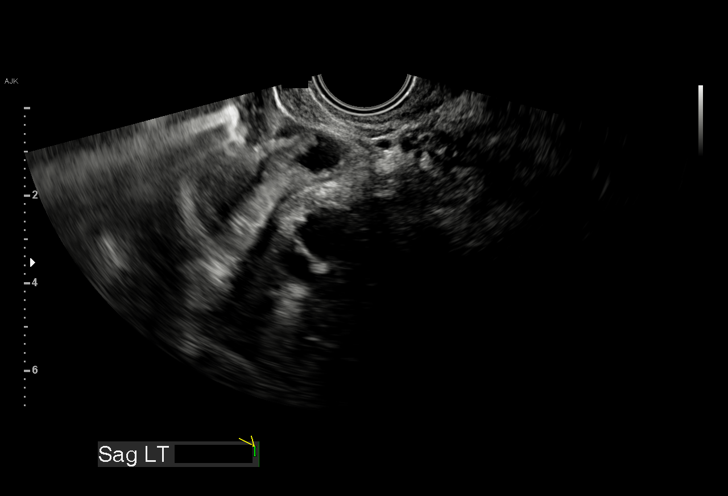
[im 69/69]
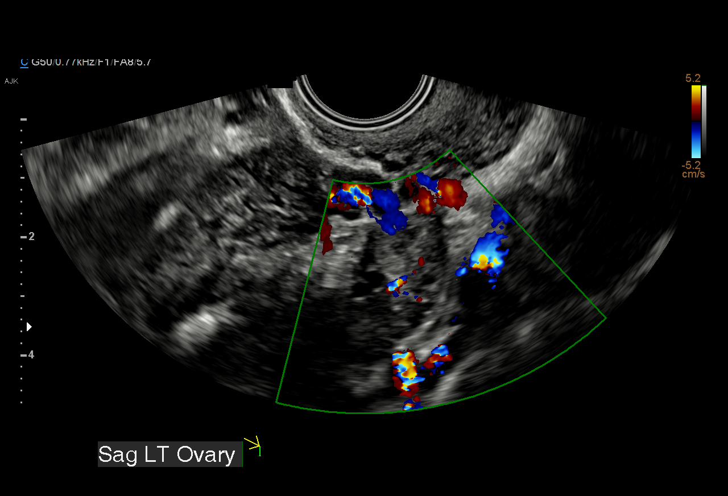

[15 of 28 positions shown; findings below may reference images not displayed]

FINDINGS: Intrauterine gestational sac: Small cystic structure identified low
in the endometrial canal, in the region of the internal cervical os.
This appears to be in the endometrium/endometrial canal and not in
the cervical parenchyma. There is no yolk sac or embryo visible
within this structure.

MSD: 4.9 mm   5 w   1 d

Subchorionic hemorrhage:  None visualized.

Maternal uterus/adnexae: Maternal ovaries unremarkable. No adnexal
mass. No intraperitoneal free fluid.
IMPRESSION: Small cystic structure identified low in the uterus, essentially at
the internal cervical os. This appears to be in the endometrial
canal rather than the cervical stroma (is would be expected for
nabothian cyst) and gestational sac in the lower uterine segment is
a consideration. Without visible yolk sac/embryo, gestational sac
can not be confirmed. No adnexal mass or free fluid. Close follow-up
recommended.

## 2024-07-13 ENCOUNTER — Observation Stay (HOSPITAL_COMMUNITY)
Admission: EM | Admit: 2024-07-13 | Discharge: 2024-07-15 | DRG: 683 | Disposition: A | Attending: Internal Medicine | Admitting: Internal Medicine

## 2024-07-13 ENCOUNTER — Other Ambulatory Visit: Payer: Self-pay

## 2024-07-13 DIAGNOSIS — N179 Acute kidney failure, unspecified: Secondary | ICD-10-CM | POA: Diagnosis not present

## 2024-07-13 DIAGNOSIS — M6282 Rhabdomyolysis: Secondary | ICD-10-CM | POA: Diagnosis not present

## 2024-07-13 DIAGNOSIS — F1590 Other stimulant use, unspecified, uncomplicated: Principal | ICD-10-CM

## 2024-07-13 DIAGNOSIS — F1990 Other psychoactive substance use, unspecified, uncomplicated: Secondary | ICD-10-CM

## 2024-07-13 DIAGNOSIS — F19959 Other psychoactive substance use, unspecified with psychoactive substance-induced psychotic disorder, unspecified: Secondary | ICD-10-CM

## 2024-07-13 DIAGNOSIS — E8729 Other acidosis: Secondary | ICD-10-CM

## 2024-07-13 LAB — URINALYSIS, ROUTINE W REFLEX MICROSCOPIC
Bilirubin Urine: NEGATIVE
Glucose, UA: NEGATIVE mg/dL
Hgb urine dipstick: NEGATIVE
Ketones, ur: 20 mg/dL — AB
Leukocytes,Ua: NEGATIVE
Nitrite: NEGATIVE
Protein, ur: NEGATIVE mg/dL
Specific Gravity, Urine: 1.017 (ref 1.005–1.030)
pH: 5 (ref 5.0–8.0)

## 2024-07-13 LAB — COMPREHENSIVE METABOLIC PANEL WITH GFR
ALT: 21 U/L (ref 0–44)
AST: 40 U/L (ref 15–41)
Albumin: 4.7 g/dL (ref 3.5–5.0)
Alkaline Phosphatase: 66 U/L (ref 38–126)
Anion gap: 19 — ABNORMAL HIGH (ref 5–15)
BUN: 26 mg/dL — ABNORMAL HIGH (ref 6–20)
CO2: 18 mmol/L — ABNORMAL LOW (ref 22–32)
Calcium: 9.7 mg/dL (ref 8.9–10.3)
Chloride: 99 mmol/L (ref 98–111)
Creatinine, Ser: 1.34 mg/dL — ABNORMAL HIGH (ref 0.44–1.00)
GFR, Estimated: 55 mL/min — ABNORMAL LOW (ref >60)
Glucose, Bld: 73 mg/dL (ref 70–99)
Potassium: 4.2 mmol/L (ref 3.5–5.1)
Sodium: 135 mmol/L (ref 135–145)
Total Bilirubin: 0.6 mg/dL (ref 0.0–1.2)
Total Protein: 8 g/dL (ref 6.5–8.1)

## 2024-07-13 LAB — ETHANOL: Alcohol, Ethyl (B): <15 mg/dL (ref <15)

## 2024-07-13 LAB — URINE DRUG SCREEN
Amphetamines: POSITIVE — AB
Barbiturates: NEGATIVE
Benzodiazepines: NEGATIVE
Cocaine: NEGATIVE
Fentanyl: NEGATIVE
Methadone Scn, Ur: NEGATIVE
Opiates: NEGATIVE
Tetrahydrocannabinol: NEGATIVE

## 2024-07-13 LAB — HCG, SERUM, QUALITATIVE: Preg, Serum: NEGATIVE

## 2024-07-13 LAB — SALICYLATE LEVEL: Salicylate Lvl: <7 mg/dL — ABNORMAL LOW (ref 7.0–30.0)

## 2024-07-13 LAB — CBC
HCT: 35.9 % — ABNORMAL LOW (ref 36.0–46.0)
Hemoglobin: 12.1 g/dL (ref 12.0–15.0)
MCH: 24.8 pg — ABNORMAL LOW (ref 26.0–34.0)
MCHC: 33.7 g/dL (ref 30.0–36.0)
MCV: 73.6 fL — ABNORMAL LOW (ref 80.0–100.0)
Platelets: 316 K/uL (ref 150–400)
RBC: 4.88 MIL/uL (ref 3.87–5.11)
RDW: 13.1 % (ref 11.5–15.5)
WBC: 13.6 K/uL — ABNORMAL HIGH (ref 4.0–10.5)
nRBC: 0 % (ref 0.0–0.2)

## 2024-07-13 LAB — CK: Total CK: 1122 U/L — ABNORMAL HIGH (ref 38–234)

## 2024-07-13 LAB — ACETAMINOPHEN LEVEL: Acetaminophen (Tylenol), Serum: <10 ug/mL — ABNORMAL LOW (ref 10–30)

## 2024-07-13 MED ORDER — LACTATED RINGERS IV BOLUS
1000.0000 mL | Freq: Once | INTRAVENOUS | Status: AC
  Administered 2024-07-13: 1000 mL via INTRAVENOUS

## 2024-07-13 MED ORDER — LORAZEPAM 1 MG PO TABS
2.0000 mg | ORAL_TABLET | Freq: Once | ORAL | Status: AC
  Administered 2024-07-13: 2 mg via ORAL
  Filled 2024-07-13: qty 2

## 2024-07-13 MED ORDER — ACETAMINOPHEN 325 MG PO TABS: 650.0000 mg | ORAL_TABLET | Freq: Four times a day (QID) | ORAL | Status: DC | PRN

## 2024-07-13 MED ORDER — BISACODYL 5 MG PO TBEC
5.0000 mg | DELAYED_RELEASE_TABLET | Freq: Every day | ORAL | Status: DC | PRN
  Administered 2024-07-14: 5 mg via ORAL
  Filled 2024-07-13: qty 1

## 2024-07-13 MED ORDER — ONDANSETRON HCL 4 MG PO TABS: 4.0000 mg | ORAL_TABLET | Freq: Four times a day (QID) | ORAL | Status: DC | PRN

## 2024-07-13 MED ORDER — ONDANSETRON HCL 4 MG/2ML IJ SOLN: 4.0000 mg | Freq: Four times a day (QID) | INTRAMUSCULAR | Status: DC | PRN

## 2024-07-13 MED ORDER — ENOXAPARIN SODIUM 40 MG/0.4ML IJ SOSY
40.0000 mg | PREFILLED_SYRINGE | INTRAMUSCULAR | Status: DC
  Administered 2024-07-14 – 2024-07-15 (×2): 40 mg via SUBCUTANEOUS
  Filled 2024-07-13 (×2): qty 0.4

## 2024-07-13 MED ORDER — SENNOSIDES-DOCUSATE SODIUM 8.6-50 MG PO TABS: 1.0000 | ORAL_TABLET | Freq: Every evening | ORAL | Status: DC | PRN

## 2024-07-13 MED ORDER — ACETAMINOPHEN 650 MG RE SUPP: 650.0000 mg | Freq: Four times a day (QID) | RECTAL | Status: DC | PRN

## 2024-07-13 MED ORDER — LACTATED RINGERS IV SOLN: INTRAVENOUS | Status: AC

## 2024-07-13 NOTE — H&P (Signed)
 History and Physical  Brianna Donaldson FMW:[id number] DOB: 24-Jul-1996 DOA: 07/13/2024  PCP: Patient, No Pcp Per   Chief Complaint: Paranoia, tweaking  HPI: Brianna Donaldson is a 28 y.o. female with medical history significant for polysubstance use disorder (methamphetamine, opioids) and substance-induced psychosis who presents to the ED for evaluation of hallucinations, paranoia and tweaking. Patient eval at bedside in the fetal position covering her somewhat a blanket. Patient is significantly restless throughout encounter. Patient seen scratching her arm and legs intermittently, endorsing bugs crawling on her skin. She denies any previous episodes like this and when informed about her ED visit last year at [facility] ED, states that it was not as bad as this.  ED Course: Initial vitals show patient afebrile, RR 15-28, HR 90-110s, SBP 120-130s, SpO2 100% on room air. Initial labs significant for BUNs/creatinine 26/1.34, bicarb 18, anion gap 19, WBC 13.6, CK 1122, UDS positive for amphetamine, negative salicylate, acetaminophen  and ethanol levels, negative pregnancy test, UA with no signs of infection. EKG shows sinus rhythm. Pt received IV LR 1 L bolus x 2, and Ativan 2 mg x 1. TRH was consulted for admission.   Review of Systems: Please see HPI for pertinent positives and negatives. A complete 10 system review of systems are otherwise negative.  Past Medical History:  Diagnosis Date   Medical history non-contributory    Past Surgical History:  Procedure Laterality Date   NO PAST SURGERIES     Social History:  reports that she has been smoking. She has never used smokeless tobacco. She reports current drug use. Drug: Marijuana. She reports that she does not drink alcohol.  No Known Allergies  Family History  Problem Relation Age of Onset   Diabetes Maternal Grandmother      Prior to Admission medications   Medication Sig Start Date End Date Taking? Authorizing  Provider  clindamycin  (CLEOCIN ) 300 MG capsule Take 1 capsule (300 mg total) by mouth 3 (three) times daily. 06/05/19   Corlis Burnard DEL, NP    Physical Exam: BP 126/88 (BP Location: Left Arm)   Pulse (!) 111   Temp 97.7 F (36.5 C) (Oral)   Resp 20   LMP  (LMP Unknown)   SpO2 100%  General: Restless young female laying in fetal position. HEENT: [place]/AT. Anicteric sclera CV: Tachycardic. Regular rhythm. Pulmonary: Lungs CTAB. Normal effort. No wheezing or rales. Skin: Warm and dry. No obvious rash or lesions. Neuro: Alert. Moves all extremities. Normal sensation to light touch. No focal deficit. Psych: Restless. Paranoid and hallucinating          Labs on Admission:  Basic Metabolic Panel: Recent Labs  Lab 07/13/24 1823  NA 135  K 4.2  CL 99  CO2 18*  GLUCOSE 73  BUN 26*  CREATININE 1.34*  CALCIUM 9.7   Liver Function Tests: Recent Labs  Lab 07/13/24 1823  AST 40  ALT 21  ALKPHOS 66  BILITOT 0.6  PROT 8.0  ALBUMIN 4.7   No results for input(s): LIPASE, AMYLASE in the last 168 hours. No results for input(s): AMMONIA in the last 168 hours. CBC: Recent Labs  Lab 07/13/24 1823  WBC 13.6*  HGB 12.1  HCT 35.9*  MCV 73.6*  PLT 316   Cardiac Enzymes: Recent Labs  Lab 07/13/24 1823  CKTOTAL 1,122*   BNP (last 3 results) No results for input(s): BNP in the last 8760 hours.  ProBNP (last 3 results) No results for input(s): PROBNP in  the last 8760 hours.  CBG: No results for input(s): GLUCAP in the last 168 hours.  Radiological Exams on Admission: No results found. Assessment/Plan Brianna Donaldson is a 28 y.o. female with medical history significant for polysubstance use disorder (methamphetamine, opioids) and substance-induced psychosis who presents to the ED for evaluation of hallucinations, paranoia and tweaking and admitted for AKI, rhabdomyolysis and substance-induced psychosis  # AKI # AGMA - Creatinine mildly elevated to 1.34, from  normal baseline, bicarb 18 and AG 19 - Likely secondary to rhabdomyolysis in the setting of methamphetamine abuse - Start IV LR at 200 cc/hr for 1 day - Trend renal function and avoid nephrotoxic meds  # Rhabdomyolysis - CK minimally elevated to 1122 on admission in the setting of methamphetamine abuse - S/p 2 L IV LR bolus in the ER, continue aggressive IVF hydration as above - Follow-up morning CK, mag and Phos  # Substance-induced psychosis - On chart review, patient seen at [facility] ED in July 2024 for substance-induced psychosis, inpatient detox offered however patient declined and resources were offered. - She presents today with increased paranoia, hallucinations and tweaking in the setting of snorting more than her usual methamphetamine - Patient continues to be restless, paranoid with tactile hallucination - Psychiatry consulted, appreciate their assistance - Patient IVC'ed on admission by ED provider  # Polysubstance use disorder - Known history of opioid and methamphetamine abuse - UDS positive only methamphetamine on admission - TOC consulted for substance abuse resources   DVT prophylaxis: Lovenox     Code Status: Full Code  Consults called: Psychiatry  Family Communication: No family at bedside  Severity of Illness: The appropriate patient status for this patient is OBSERVATION. Observation status is judged to be reasonable and necessary in order to provide the required intensity of service to ensure the patient's safety. The patient's presenting symptoms, physical exam findings, and initial radiographic and laboratory data in the context of their medical condition is felt to place them at decreased risk for further clinical deterioration. Furthermore, it is anticipated that the patient will be medically stable for discharge from the hospital within 2 midnights of admission.   Level of care: Telemetry    Brianna Claretta CHRISTELLA, MD 07/13/2024, 10:56 PM [facility] Pager: [phone] Isaiah 41:10   If 7PM-7AM, please contact night-coverage [email or web address] Password [email or web address]

## 2024-07-13 NOTE — ED Provider Notes (Signed)
 [place] EMERGENCY DEPARTMENT AT [facility] Provider Note   CSN: [id number] Arrival date & time: 07/13/24  8283     Patient presents with: Drug Problem   TERENA BOHAN is a 28 y.o. female.   28 year old presenting emergency department after snorting methamphetamine.  Reports that she took more than she typically does.  Having hallucinations thinking that bugs are crawling on her.  Feels somewhat paranoid as well.  Denies suicidal ideation, homicidal ideation.   Drug Problem       Prior to Admission medications   Medication Sig Start Date End Date Taking? Authorizing Provider  clindamycin  (CLEOCIN ) 300 MG capsule Take 1 capsule (300 mg total) by mouth 3 (three) times daily. 06/05/19   Corlis Burnard DEL, NP    Allergies: Patient has no known allergies.    Review of Systems  Updated Vital Signs BP 126/88 (BP Location: Left Arm)   Pulse (!) 111   Temp 97.7 F (36.5 C) (Oral)   Resp 20   LMP  (LMP Unknown)   SpO2 100%   Physical Exam  (all labs ordered are listed, but only abnormal results are displayed) Labs Reviewed  CBC - Abnormal; Notable for the following components:      Result Value   WBC 13.6 (*)    HCT 35.9 (*)    MCV 73.6 (*)    MCH 24.8 (*)    All other components within normal limits  COMPREHENSIVE METABOLIC PANEL WITH GFR - Abnormal; Notable for the following components:   CO2 18 (*)    BUN 26 (*)    Creatinine, Ser 1.34 (*)    GFR, Estimated 55 (*)    Anion gap 19 (*)    All other components within normal limits  ACETAMINOPHEN  LEVEL - Abnormal; Notable for the following components:   Acetaminophen  (Tylenol ), Serum <10 (*)    All other components within normal limits  SALICYLATE LEVEL - Abnormal; Notable for the following components:   Salicylate Lvl <7.0 (*)    All other components within normal limits  CK - Abnormal; Notable for the following components:   Total CK 1,122 (*)    All other components within normal limits   URINALYSIS, ROUTINE W REFLEX MICROSCOPIC - Abnormal; Notable for the following components:   APPearance HAZY (*)    Ketones, ur 20 (*)    All other components within normal limits  URINE DRUG SCREEN - Abnormal; Notable for the following components:   Amphetamines POSITIVE (*)    All other components within normal limits  HCG, SERUM, QUALITATIVE  ETHANOL    EKG: EKG Interpretation Date/Time:  Monday July 13 2024 18:44:16 EST Ventricular Rate:  94 PR Interval:  144 QRS Duration:  84 QT Interval:  353 QTC Calculation: 442 R Axis:   83  Text Interpretation: Sinus rhythm Right atrial enlargement Confirmed by Neysa Clap [phone]) on 07/13/2024 10:03:51 PM  Radiology: No results found.   Procedures   Medications Ordered in the ED  LORazepam (ATIVAN) tablet 2 mg (2 mg Oral Given 07/13/24 1802)  lactated ringers bolus 1,000 mL (1,000 mLs Intravenous Bolus 07/13/24 2002)  lactated ringers bolus 1,000 mL (1,000 mLs Intravenous Bolus 07/13/24 2043)    Clinical Course as of 07/13/24 2206  Mon Jul 13, 2024  1930 Creatinine(!): 1.34 Minor elevation in her creatinine.  Baseline appears to be ~1.0. Will send off CK to evaluate for rhabdo.  IV fluids ordered. [TY]  2202 CK Total(!): 1,122 2nd liter of  fluids ordered.  [TY]  2203 Alcohol, Ethyl (B): <15 [TY]  2203 Salicylate Lvl(!): <7.0 [TY]  2203 Acetaminophen  (Tylenol ), S(!): <10 [TY]  2203 Amphetamines(!): POSITIVE [TY]  2206 Patient with persistent tachycardia despite 2 L of IV fluids does have mild AKI and elevated CK.  Will admit for observation for rhabdomyolysis and substance-induced psychosis. [TY]    Clinical Course User Index [TY] Neysa Caron PARAS, DO                                 Medical Decision Making 28 year old presenting emergency department with paranoia, hallucination and psychosis after taking methamphetamines earlier today.  She is afebrile, nontachycardic, maintaining oxygen saturation on room air.  Moving  all extremities today coordinated fashion without localizing deficits.  Denies SI, HI, but does endorse hallucinations.  Will get screening labs and give IV fluids.  See ED course for further MDM final disposition  Amount and/or Complexity of Data Reviewed External Data Reviewed:     Details: Was admitted in 2024 for opioid and methamphetamine induced psychosis at [facility] Labs: ordered. Decision-making details documented in ED Course. Radiology: independent interpretation performed. ECG/medicine tests: ordered and independent interpretation performed.    Details: Sinus tachycardia.  Risk Prescription drug management. Decision regarding hospitalization. Diagnosis or treatment significantly limited by social determinants of health.       Final diagnoses:  Non-traumatic rhabdomyolysis  AKI (acute kidney injury)  Methamphetamine use    ED Discharge Orders     None          Neysa Caron PARAS, DO 07/13/24 2206

## 2024-07-13 NOTE — ED Triage Notes (Signed)
 Pt arrives with crisis services. Pt is having paranoia, hallucinations, and psychosis in the presence of snorting methamphetamine. Pt alert and cooperative, but hyperactive. Pt states she is tweaking.   Pts mother called crisis services for pt.

## 2024-07-13 NOTE — ED Notes (Signed)
 Unable to get labs and EKG at this current time due to pt tweaking and not able to sit still and safely get get labs. Medication just given will attempt once pt is able to be somewhat be still and safely attempt to stick her for labs.

## 2024-07-14 LAB — COMPREHENSIVE METABOLIC PANEL WITH GFR
ALT: 17 U/L (ref 0–44)
AST: 30 U/L (ref 15–41)
Albumin: 3.6 g/dL (ref 3.5–5.0)
Alkaline Phosphatase: 50 U/L (ref 38–126)
Anion gap: 10 (ref 5–15)
BUN: 16 mg/dL (ref 6–20)
CO2: 21 mmol/L — ABNORMAL LOW (ref 22–32)
Calcium: 8.5 mg/dL — ABNORMAL LOW (ref 8.9–10.3)
Chloride: 104 mmol/L (ref 98–111)
Creatinine, Ser: 0.98 mg/dL (ref 0.44–1.00)
GFR, Estimated: >60 mL/min (ref >60)
Glucose, Bld: 72 mg/dL (ref 70–99)
Potassium: 3.5 mmol/L (ref 3.5–5.1)
Sodium: 135 mmol/L (ref 135–145)
Total Bilirubin: 0.5 mg/dL (ref 0.0–1.2)
Total Protein: 5.9 g/dL — ABNORMAL LOW (ref 6.5–8.1)

## 2024-07-14 LAB — CBC
HCT: 31.2 % — ABNORMAL LOW (ref 36.0–46.0)
Hemoglobin: 10.4 g/dL — ABNORMAL LOW (ref 12.0–15.0)
MCH: 24.6 pg — ABNORMAL LOW (ref 26.0–34.0)
MCHC: 33.3 g/dL (ref 30.0–36.0)
MCV: 73.8 fL — ABNORMAL LOW (ref 80.0–100.0)
Platelets: 255 K/uL (ref 150–400)
RBC: 4.23 MIL/uL (ref 3.87–5.11)
RDW: 13.2 % (ref 11.5–15.5)
WBC: 8.4 K/uL (ref 4.0–10.5)
nRBC: 0 % (ref 0.0–0.2)

## 2024-07-14 LAB — CK: Total CK: 1008 U/L — ABNORMAL HIGH (ref 38–234)

## 2024-07-14 LAB — PHOSPHORUS: Phosphorus: 3 mg/dL (ref 2.5–4.6)

## 2024-07-14 LAB — HIV ANTIBODY (ROUTINE TESTING W REFLEX): HIV Screen 4th Generation wRfx: NONREACTIVE

## 2024-07-14 LAB — MAGNESIUM: Magnesium: 2.2 mg/dL (ref 1.7–2.4)

## 2024-07-14 MED ORDER — HYDROXYZINE HCL 25 MG PO TABS: 25.0000 mg | ORAL_TABLET | Freq: Three times a day (TID) | ORAL | Status: DC | PRN

## 2024-07-14 MED ORDER — HYDROXYZINE HCL 25 MG PO TABS: 25.0000 mg | ORAL_TABLET | Freq: Four times a day (QID) | ORAL | Status: DC | PRN

## 2024-07-14 MED ORDER — CLONIDINE HCL 0.1 MG PO TABS
0.1000 mg | ORAL_TABLET | Freq: Four times a day (QID) | ORAL | Status: DC | PRN
  Filled 2024-07-14: qty 1

## 2024-07-14 NOTE — Plan of Care (Signed)
   Problem: Education: Goal: Knowledge of General Education information will improve Description: Including pain rating scale, medication(s)/side effects and non-pharmacologic comfort measures Outcome: Progressing   Problem: Clinical Measurements: Goal: Ability to maintain clinical measurements within normal limits will improve Outcome: Progressing

## 2024-07-14 NOTE — Progress Notes (Signed)
 Consultation Progress Note   Patient: Brianna Donaldson FMW:[id number] DOB: [birth year] DOA: 07/13/2024 DOS: the patient was seen and examined on 07/14/2024 Primary service: Milam Allbaugh, Landon BRAVO, MD  Brief hospital course:  28 y.o. female with medical history significant for polysubstance use disorder (methamphetamine, opioids) and substance-induced psychosis who presents to the ED for evaluation of hallucinations, paranoia and tweaking. Patient eval at bedside in the fetal position covering her somewhat a blanket. Patient is significantly restless throughout encounter. Patient seen scratching her arm and legs intermittently, endorsing bugs crawling on her skin. She denies any previous episodes like this and when informed about her ED visit last year at [facility] ED, states that it was not as bad as this.   ED Course: Initial vitals show patient afebrile, RR 15-28, HR 90-110s, SBP 120-130s, SpO2 100% on room air. Initial labs significant for BUNs/creatinine 26/1.34, bicarb 18, anion gap 19, WBC 13.6, CK 1122, UDS positive for amphetamine, negative salicylate, acetaminophen  and ethanol levels, negative pregnancy test, UA with no signs of infection. EKG shows sinus rhythm. Pt received IV LR 1 L bolus x 2, and Ativan 2 mg x 1. TRH was consulted for admission.   Assessment and Plan: 28 y.o. female with medical history significant for polysubstance use disorder (methamphetamine, opioids) and substance-induced psychosis who presents to the ED for evaluation of hallucinations, paranoia and tweaking and admitted for AKI, rhabdomyolysis and substance-induced psychosis   # AKI # AGMA - Creatinine  normal this AM -Normal AG, Bicarb improved. -On LR 200/hr   # Rhabdomyolysis - CK minimally elevated to 1122 on admission in the setting of methamphetamine abuse - S/p 2 L IV LR bolus in the ER, continue aggressive IVF hydration as above -Improved CK-1008 -Continue fluids.   # Substance-induced  psychosis - On chart review, patient seen at [facility] ED in July 2024 for substance-induced psychosis, inpatient detox offered however patient declined and resources were offered. - She presents with increased paranoia, hallucinations and tweaking in the setting of snorting more than her usual methamphetamine - Patient continues to be restless, paranoid with tactile hallucination - Psychiatry consulted, appreciate their assistance - Patient IVC'ed on admission by ED provider   # Polysubstance use disorder - Known history of opioid and methamphetamine abuse - UDS positive only methamphetamine on admission - TOC consulted for substance abuse resources           TRH will continue to follow the patient.  Subjective: Seen at bedside this morning, she made head gestures to answering my questions, she would not speak. She denies pain, nausea, vomiting, fevers chills. Her Sitter states she was up eating breakfast earlier in the morning. BP is soft, patient denies any associated symptoms.  Physical Exam: Vitals:   07/14/24 0115 07/14/24 0118 07/14/24 0212 07/14/24 0411  BP: (!) 100/59 (!) 103/51 (!) 91/56 91/64  Pulse:   (!) 105 98  Resp:   16 14  Temp:   97.8 F (36.6 C) 98.9 F (37.2 C)  TempSrc:      SpO2:   100% 100%   General  No acute Distress Eyes: PERRL, lids and conjunctivae normal ENMT: Mucous membranes are moist.   Neck: normal, supple, no masses, no thyromegaly Respiratory: clear to auscultation bilaterally, no wheezing, no crackles. Normal respiratory effort. No accessory muscle use.  Cardiovascular: Regular rate and rhythm, no murmurs / rubs / gallops Abdomen: Soft, nontender nondistended. Musculoskeletal: no clubbing / cyanosis. No joint deformity upper and lower extremities.  Skin: no rashes, lesions, ulcers. No induration Neurologic: Facial asymmetry, moving extremity spontaneously. Data Reviewed:    CBC    Component Value Date/Time   WBC 8.4  07/14/2024 0523   RBC 4.23 07/14/2024 0523   HGB 10.4 (L) 07/14/2024 0523   HCT 31.2 (L) 07/14/2024 0523   PLT 255 07/14/2024 0523   MCV 73.8 (L) 07/14/2024 0523   MCH 24.6 (L) 07/14/2024 0523   MCHC 33.3 07/14/2024 0523   RDW 13.2 07/14/2024 0523   LYMPHSABS 3.1 04/25/2015 1602   MONOABS 1.9 (H) 04/25/2015 1602   EOSABS 0.1 04/25/2015 1602   BASOSABS 0.1 04/25/2015 1602   CMP     Component Value Date/Time   NA 135 07/14/2024 0523   K 3.5 07/14/2024 0523   CL 104 07/14/2024 0523   CO2 21 (L) 07/14/2024 0523   GLUCOSE 72 07/14/2024 0523   BUN 16 07/14/2024 0523   CREATININE 0.98 07/14/2024 0523   CALCIUM 8.5 (L) 07/14/2024 0523   PROT 5.9 (L) 07/14/2024 0523   ALBUMIN 3.6 07/14/2024 0523   AST 30 07/14/2024 0523   ALT 17 07/14/2024 0523   ALKPHOS 50 07/14/2024 0523   BILITOT 0.5 07/14/2024 0523   GFRNONAA >60 07/14/2024 0523    Family Communication: None at bedside  Time spent: 35 minutes.  Author: Landon FORBES Baller, MD 07/14/2024 8:44 AM  For on call review [email or web address].

## 2024-07-14 NOTE — Progress Notes (Signed)
   07/14/24 0852  Assess: MEWS Score  Temp 98.1 F (36.7 C)  BP (!) 104/57  MAP (mmHg) 68  Pulse Rate (!) 116  Resp 18  SpO2 100 %  O2 Device Room Air  Assess: MEWS Score  MEWS Temp 0  MEWS Systolic 0  MEWS Pulse 2  MEWS RR 0  MEWS LOC 0  MEWS Score 2  MEWS Score Color Yellow  Assess: if the MEWS score is Yellow or Red  Were vital signs accurate and taken at a resting state? Yes  Does the patient meet 2 or more of the SIRS criteria? Yes  Does the patient have a confirmed or suspected source of infection? No  MEWS guidelines implemented  Yes, yellow  Treat  MEWS Interventions Considered administering scheduled or prn medications/treatments as ordered  Take Vital Signs  Increase Vital Sign Frequency  Yellow: Q2hr x1, continue Q4hrs until patient remains green for 12hrs  Escalate  MEWS: Escalate Yellow: Discuss with charge nurse and consider notifying provider and/or RRT  Notify: Charge Nurse/RN  Name of Charge Nurse/RN Associate Professor  Provider Notification  Provider Name/Title Dibia , Landon  Date Provider Notified 07/14/24  Time Provider Notified 0900  Method of Notification Page  Notification Reason Change in status  Provider response No new orders  Date of Provider Response 07/14/24  Time of Provider Response 0912  Assess: SIRS CRITERIA  SIRS Temperature  0  SIRS Respirations  0  SIRS Pulse 1  SIRS WBC 0  SIRS Score Sum  1

## 2024-07-14 NOTE — Consult Note (Cosign Needed Addendum)
 [facility] Psychiatric Consult Initial  Patient Name: .Brianna Donaldson  MRN: [id number]  DOB: 08/17/1995  Consult Order details:  Orders (From admission, onward)     Start     Ordered   07/13/24 2257  IP CONSULT TO PSYCHIATRY       Comments: History of methamphetamine and opioid abuse  Ordering Provider: Lou Claretta CHRISTELLA, MD  Provider:  (Not yet assigned)  Question Answer Comment  Location [facility]   Reason for Consult? Substance-induced psychosis and hallucination      07/13/24 2258             Mode of Visit: In person    Psychiatry Consult Evaluation  Service Date: July 14, 2024 LOS:  LOS: 0 days  Chief Complaint   Primary Psychiatric Diagnoses  Substance induced psychosis 2.   3.    Assessment  Brianna Donaldson is a 28 y.o. female admitted: Medicallyfor 07/13/2024  5:20 PM for psychosis. She carries the psychiatric diagnoses of methamphetamine psychosis and has a past medical history of  anemia.   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 She meets criteria for this diagnosis based on her recent use of methamphetamine and cocaine followed by transient psychotic symptoms (paranoia, delusions, hallucinations) that remitted after cessation of substance use and metabolization.  On initial examination, the patient was disorganized, agitated, and exhibiting paranoid ideation, all of which have now resolved.  She was assessed for mania during the psychotic episode; there was no evidence of manic symptoms, pressured speech, grandiosity, or decreased need for sleep. There is no history or current evidence to indicate high risk for suicide during substance use at this time. Patient currently denies SI, HI, or intent to harm self or others.  HPI: This is a female patient with a history of polysubstance use who presented to the ED  on 07/14/24 under involuntary commitment for evaluation of suicidal and homicidal ideation, auditory and visual hallucinations, and behavioral disturbance in the setting of recent methamphetamine use. Per chart review, she has multiple prior admissions for psychosis secondary to stimulant intoxication, each of which resolved shortly after substance metabolization, consistent with substance-induced psychosis. During the current admission, the patient initially exhibited paranoia, delusional thinking, and agitation requiring pharmacologic intervention and close observation. Since admission, her symptoms have steadily improved as the substances cleared from her system.  On reassessment today, she reports feeling "normal" and acknowledges that cocaine and methamphetamine use have negatively impacted her mental health. She denies any current suicidal or homicidal ideation, hallucinations, or paranoia. She expresses curiosity about the connection between substance use and hallucinations, asking whether others experience similar symptoms. She denies ongoing depression, anxiety, or irritability. Her mood and affect have markedly improved compared to the day prior. She is calm, cooperative, and fully engaged in the conversation. She verbalizes interest in outpatient substance use treatment but does not wish to remain hospitalized. She plans to discharge to her sister's home, which she describes as a supportive environment.  On examination, the patient is alert, oriented, and well-appearing. She presents with a pleasant affect, is calm, cooperative, and fully engaged in the interview. Speech is spontaneous, coherent, and goal-directed. Thought process is linear and logical with no flight of ideas or tangentiality. Thought content is appropriate with no evidence of delusions or perceptual disturbances. She denies current suicidal or homicidal ideation. Mood is improved and affect is congruent. Insight and judgment appear  fair. The patient is eating appropriately, attending to self-care, and participating appropriately in conversation with reciprocal engagement.  Assessment: Female patient with a history of polysubstance use disorder, presenting with methamphetamine-induced psychosis, now resolved following substance metabolization. She is clinically stable with no evidence of ongoing psychosis or mood disturbance.  Diagnoses:  Active Hospital problems: Principal Problem:   AKI (acute kidney injury) Active Problems:   Non-traumatic rhabdomyolysis   Psychoactive substance-induced psychosis (HCC)   Methamphetamine use   Polysubstance use disorder   High anion gap metabolic acidosis    Plan   ## Psychiatric Medication Recommendations:  Continue current prn medications. Symptoms are resolving  ## Medical Decision Making Capacity: Not specifically addressed in this encounter  ## Further Work-up:  -- Defer to primary TSH, B12, folate, EKG, While pt on Qtc prolonging medications, please monitor & replete K+ to 4 and Mg2+ to 2, TOC consult for substance abuse resources, U/A, or UDS  -- Pertinent labwork reviewed earlier this admission includes:    ## Disposition:-- There are no psychiatric contraindications to discharge at this time -No longer meets criteria for IVC. May rescind once patient is  medically stable to discharge.   ## Behavioral / Environmental: - No specific recommendations at this time.     ## Safety and Observation Level:  - Based on my clinical evaluation, I estimate the patient to be at low risk of self harm in the current setting. - At this time, we recommend  routine. This decision is based on my review of the chart including patient's history and current presentation, interview of the patient, mental status examination, and consideration of suicide risk including evaluating suicidal ideation, plan, intent, suicidal or self-harm behaviors, risk factors, and protective factors. This  judgment is based on our ability to directly address suicide risk, implement suicide prevention strategies, and develop a safety plan while the patient is in the clinical setting. Please contact our team if there is a concern that risk level has changed.  CSSR Risk Category:C-SSRS RISK CATEGORY: No Risk  Suicide Risk Assessment: Patient has following modifiable risk factors for suicide: recklessness, lack of access to outpatient mental health resources, active mental illness (to encompass adhd, tbi, mania, psychosis, trauma reaction), current symptoms: anxiety/panic, insomnia, impulsivity, anhedonia, hopelessness, and triggering events, which we are addressing by medication and outpatient resources. Patient has following non-modifiable or demographic risk factors for suicide: family history of mental illness Patient has the following protective factors against suicide: Access to outpatient mental health care, Supportive family, Supportive friends, Cultural, spiritual, or religious beliefs that discourage suicide, Frustration tolerance, no history of suicide attempts, and no history of NSSIB  Thank you for this consult request. Recommendations have been communicated to the primary team.  We will Sign off at this time.   Majel GORMAN Ramp, FNP       History of Present Illness  Patient Report:   Patient is a female with a history of polysubstance use who initially presented to the ED under IVC for reports of suicidal and homicidal ideation with auditory and visual hallucinations in the context of medication noncompliance and recent stimulant use. Chart review indicates multiple prior episodes of psychosis secondary to substance intoxication, which typically resolve following metabolization, suggesting a pattern of substance-induced psychosis.   On today's evaluation, the patient reports feeling "normal" and acknowledges that cocaine and methamphetamine use have negatively impacted her life. She  denies current depression, anxiety, irritability, hallucinations, or suicidal/homicidal thoughts. She states she no longer feels paranoid,  impulsive, or fearful. She demonstrates insight into the connection between her substance use and mental health, though she remains somewhat in denial, minimizing the severity of her addiction while expressing curiosity about whether hallucinations are common with stimulant use. The patient reports that she would like some help but does not wish to remain in the hospital. She expresses interest in outpatient substance use treatment and plans to stay with her sister, who is reportedly very supportive.   Review of Systems  Psychiatric/Behavioral:  Positive for hallucinations and substance abuse (MEth). The patient is nervous/anxious.   All other systems reviewed and are negative.    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 She smokes it.  Prescription drug abuse: Denies Rehab hx: Denies   Exam Findings  Physical Exam: Patient with long  dreadlocks, dressed appropriately in hospital gown eating lunch.   Vital Signs:  Temp:  [97.7 F (36.5 C)-98.9 F (37.2 C)] 98.5 F (36.9 C) (12/02 1547) Pulse Rate:  [95-116] 106 (12/02 1552) Resp:  [14-24] 18 (12/02 1249) BP: (86-126)/(51-88) 93/54 (12/02 1552) SpO2:  [99 %-100 %] 100 % (12/02 1547) Blood pressure (!) 93/54, pulse (!) 106, temperature 98.5 F (36.9 C), resp. rate 18, SpO2 100%, unknown if currently breastfeeding. There is no height or weight on file to calculate BMI.  Physical Exam Vitals and nursing note reviewed.  Constitutional:      Appearance: Normal appearance. She is normal weight.  Skin:    General: Skin is warm and dry.  Neurological:     General: No focal deficit present.     Mental Status: She is alert and oriented to person, place, and time. Mental status is at baseline.  Psychiatric:        Mood and Affect: Mood normal.        Behavior: Behavior normal.        Thought Content: Thought content normal.        Judgment: Judgment normal.     Mental Status Exam: General Appearance: Casual and Fairly Groomed  Orientation:  Full (Time, Place, and Person)  Memory:  Immediate;   Fair Recent;   Fair  Concentration:  Concentration: Fair and Attention Span: Fair  Recall:  Fair  Attention  Fair  Eye Contact:  Fair  Speech:  Clear and Coherent and Normal Rate  Language:  Fair  Volume:  Normal  Mood: I'm good  Affect:  Appropriate and Congruent  Thought Process:  Coherent, Goal Directed, Linear, and Descriptions of Associations: Intact  Thought Content:  Logical  Suicidal Thoughts:  No  Homicidal Thoughts:  No  Judgement:  Fair  Insight:  Good  Psychomotor Activity:  Normal  Akathisia:  No  Fund of Knowledge:  Good      Assets:  Communication Skills Desire for Improvement Financial Resources/Insurance Housing Leisure Time Physical Health Social Support Talents/Skills  Cognition:  WNL  ADL's:  Intact  AIMS (if indicated):         Other History   These have been pulled in through the EMR, reviewed, and updated if appropriate.  Family History:  The patient's family history includes Diabetes in her maternal grandmother.  Medical History: Past Medical History:  Diagnosis Date   Medical history non-contributory     Surgical History: Past Surgical History:  Procedure Laterality Date   NO PAST SURGERIES       Medications:   Current Facility-Administered Medications:    acetaminophen  (TYLENOL ) tablet 650 mg, 650 mg, Oral, Q6H PRN **OR** acetaminophen  (TYLENOL ) suppository 650 mg, 650 mg, Rectal, Q6H PRN, Amponsah, Claretta HERO, MD   bisacodyl (DULCOLAX) EC tablet 5 mg, 5 mg, Oral, Daily PRN, Lou Claretta HERO, MD, 5 mg at 07/14/24 1303   cloNIDine (CATAPRES) tablet 0.1 mg, 0.1 mg, Oral, Q6H PRN, Donati-Garmon, Natalie M, NP   enoxaparin (LOVENOX) injection 40 mg, 40 mg, Subcutaneous, Q24H, Amponsah, Prosper M, MD, 40 mg at 07/14/24 1251   hydrOXYzine (ATARAX) tablet 25 mg, 25 mg, Oral, Q6H PRN, Donati-Garmon, Natalie M, NP   lactated ringers infusion, , Intravenous, Continuous, Lou Claretta HERO, MD, Last Rate: 200 mL/hr at 07/14/24 1547, New Bag at 07/14/24 1547   ondansetron (ZOFRAN) tablet 4 mg, 4 mg, Oral, Q6H PRN **OR** ondansetron (ZOFRAN) injection 4 mg, 4 mg, Intravenous, Q6H PRN, Amponsah, Claretta HERO, MD   senna-docusate (Senokot-S) tablet 1 tablet, 1 tablet, Oral, QHS PRN, Lou, Claretta HERO, MD  Allergies: No Known Allergies  Majel GORMAN Ramp, FNP

## 2024-07-14 NOTE — Plan of Care (Signed)
  Problem: Education: Goal: Knowledge of General Education information will improve Description: Including pain rating scale, medication(s)/side effects and non-pharmacologic comfort measures Outcome: Progressing   Problem: Clinical Measurements: Goal: Will remain free from infection Outcome: Progressing   Problem: Nutrition: Goal: Adequate nutrition will be maintained Outcome: Progressing   Problem: Coping: Goal: Level of anxiety will decrease Outcome: Progressing   Problem: Elimination: Goal: Will not experience complications related to bowel motility Outcome: Progressing Goal: Will not experience complications related to urinary retention Outcome: Progressing   Problem: Safety: Goal: Ability to remain free from injury will improve Outcome: Progressing

## 2024-07-14 NOTE — Progress Notes (Signed)
   07/14/24 0115 07/14/24 0118  Assess: MEWS Score  BP (!) 100/59 (!) 103/51   Patient's primary RN asked this RN to give clonidine to the patient. RN checked BP, see above. This RN notified patient's primary RN of the above BPs. This RN will not administer clonidine at this time, primary RN notified.

## 2024-07-15 ENCOUNTER — Other Ambulatory Visit (HOSPITAL_COMMUNITY): Payer: Self-pay

## 2024-07-15 LAB — URINALYSIS, ROUTINE W REFLEX MICROSCOPIC
Bilirubin Urine: NEGATIVE
Glucose, UA: NEGATIVE mg/dL
Hgb urine dipstick: NEGATIVE
Ketones, ur: NEGATIVE mg/dL
Leukocytes,Ua: NEGATIVE
Nitrite: NEGATIVE
Protein, ur: NEGATIVE mg/dL
Specific Gravity, Urine: 1.009 (ref 1.005–1.030)
pH: 7 (ref 5.0–8.0)

## 2024-07-15 MED ORDER — ACETAMINOPHEN 325 MG PO TABS: 650.0000 mg | ORAL_TABLET | Freq: Four times a day (QID) | ORAL | Status: AC | PRN

## 2024-07-15 MED ORDER — HYDROXYZINE HCL 50 MG PO TABS
50.0000 mg | ORAL_TABLET | Freq: Every evening | ORAL | 0 refills | Status: AC
  Filled 2024-07-15: qty 30, 30d supply, fill #0

## 2024-07-15 MED ORDER — OLANZAPINE 5 MG PO TABS
5.0000 mg | ORAL_TABLET | Freq: Every evening | ORAL | 0 refills | Status: AC
  Filled 2024-07-15: qty 30, 30d supply, fill #0

## 2024-07-15 NOTE — Plan of Care (Signed)
   Problem: Education: Goal: Knowledge of General Education information will improve Description: Including pain rating scale, medication(s)/side effects and non-pharmacologic comfort measures Outcome: Progressing   Problem: Health Behavior/Discharge Planning: Goal: Ability to manage health-related needs will improve Outcome: Progressing   Problem: Clinical Measurements: Goal: Ability to maintain clinical measurements within normal limits will improve Outcome: Progressing Goal: Diagnostic test results will improve Outcome: Progressing Goal: Respiratory complications will improve Outcome: Progressing   Problem: Activity: Goal: Risk for activity intolerance will decrease Outcome: Progressing   Problem: Nutrition: Goal: Adequate nutrition will be maintained Outcome: Progressing   Problem: Coping: Goal: Level of anxiety will decrease Outcome: Progressing

## 2024-07-15 NOTE — Hospital Course (Signed)
 28yo with h/o polysubstance use d/o (methamphetamine and opioids) and substance-induced psychosis who presented on 12/1 with acute psychosis.  She was admitted for AKI, nontraumatic rhabdomyolysis.  Psychiatry consulted and has cleared for discharge.

## 2024-07-15 NOTE — Progress Notes (Signed)
 Discharge mediations delivered to patient at the bedside

## 2024-07-15 NOTE — TOC Transition Note (Signed)
 Transition of Care [facility]) - Discharge Note  Patient Details  Name: Brianna Donaldson MRN: [id number] Date of Birth: Apr 11, 1996  [facility]) CM/SW Contact:  Duwaine GORMAN Aran, LCSW Phone Number: 07/15/2024, 2:08 PM  Clinical Narrative: Care management consulted for PCP needs and substance use resources. Patient has an assigned PCP through her Medicaid, which has been added to the chart. Substance use resources added to AVS. Care management signing off.  Final next level of care: Home/Self Care Barriers to Discharge: No Barriers Identified  Patient Goals and CMS Choice Choice offered to / list presented to : NA  Discharge Plan and Services Additional resources added to the After Visit Summary for         DME Arranged: N/A DME Agency: NA  Social Drivers of Health (SDOH) Interventions SDOH Screenings   Food Insecurity: No Food Insecurity (07/14/2024)  Housing: Low Risk  (07/14/2024)  Transportation Needs: No Transportation Needs (07/14/2024)  Utilities: Not At Risk (07/14/2024)  Social Connections: Socially Isolated (07/14/2024)  Tobacco Use: High Risk (02/21/2023)   Received from [facility]   Readmission Risk Interventions     No data to display

## 2024-07-15 NOTE — Discharge Summary (Signed)
 Physician Discharge Summary   Patient: Brianna Donaldson MRN: [id number] DOB: Jul 18, 1996  Admit date:     07/13/2024  Discharge date: 07/15/24  Discharge Physician: Delon Herald   PCP: Patient, No Pcp Per   Recommendations at discharge:   Do NOT use methamphetamine You are being prescribed Zyprexa  and hydroxyzine  and will need outpatient psychiatry follow up; referral made and you can also go [facility] if needed Follow up with PCP ([facility]) in 1-2 weeks; call for an appointment  Discharge Diagnoses: Principal Problem:   AKI (acute kidney injury) Active Problems:   Non-traumatic rhabdomyolysis   Psychoactive substance-induced psychosis (HCC)   Methamphetamine use   Polysubstance use disorder   High anion gap metabolic acidosis   Hospital Course: 28yo with h/o polysubstance use d/o (methamphetamine and opioids) and substance-induced psychosis who presented on 12/1 with acute psychosis.  She was admitted for AKI, nontraumatic rhabdomyolysis.  Psychiatry consulted and has cleared for discharge.  Assessment and Plan:    Substance-induced psychosis On chart review, patient seen at [facility] ED in July 2024 for substance-induced psychosis; inpatient detox offered however patient declined and resources were offered She presented with increased paranoia, hallucinations and tweaking in the setting of snorting more than her usual methamphetamine Psychiatry consulted Patient IVC'ed on admission by ED provider but this was rescinded by psychiatry Patient reports that Zyprexa  and hydroxyzine  are helpful for her mood and discourages meth use; will provide 1 month supply She feels stable for dc to home today  AKI AGMA Presented with acute psychosis following methamphetamine use Found to have AGI/AGMA Hydrated with resolution Reported polyuria today, concerned for UTI; UA checked and WNL, culture is pending Suspect that polyuria is related to aggressive IVF  hydration   Rhabdomyolysis CK minimally elevated to 1122 on admission in the setting of methamphetamine abuse Improved with IVF   Polysubstance use disorder Known history of opioid and methamphetamine abuse UDS positive only methamphetamine on admission TOC consulted for substance abuse resources     Consultants: Psychiatry TOC team  Procedures: None  Antibiotics: None    Pain control - [place]  Controlled Substance Reporting System database was reviewed. and patient was instructed, not to drive, operate heavy machinery, perform activities at heights, swimming or participation in water activities or provide baby-sitting services while on Pain, Sleep and Anxiety Medications; until their outpatient Physician has advised to do so again. Also recommended to not to take more than prescribed Pain, Sleep and Anxiety Medications.   Disposition: Home Diet recommendation:  Regular diet DISCHARGE MEDICATION: Allergies as of 07/15/2024   No Known Allergies      Medication List     TAKE these medications    acetaminophen  325 MG tablet Commonly known as: TYLENOL  Take 2 tablets (650 mg total) by mouth every 6 (six) hours as needed for mild pain (pain score 1-3) or fever (or Fever >/= 101).   hydrOXYzine  50 MG tablet Commonly known as: ATARAX  Take 1 tablet (50 mg total) by mouth every evening.   OLANZapine  5 MG tablet Commonly known as: ZYPREXA  Take 1 tablet (5 mg total) by mouth every evening.        Discharge Exam:    Subjective: Feeling better, no longer hallucinating, wants to go home today.   Objective: Vitals:   07/14/24 2018 07/15/24 0443  BP: (!) 94/57 (!) 96/59  Pulse: 86 86  Resp: 16 16  Temp: 98.4 F (36.9 C) 98.4 F (36.9 C)  SpO2: 100%  100%    Intake/Output Summary (Last 24 hours) at 07/15/2024 1332 Last data filed at 07/15/2024 0915 Gross per 24 hour  Intake 3848.55 ml  Output --  Net 3848.55 ml   Filed Weights   07/15/24 0443   Weight: 60.6 kg    Exam:  General:  Appears calm and comfortable and is in NAD Eyes:  normal lids, iris ENT:  grossly normal hearing, lips & tongue, mmm Cardiovascular:  RR with mild tachycardia. No LE edema.  Respiratory:   CTA bilaterally with no wheezes/rales/rhonchi.  Normal respiratory effort. Abdomen:  soft, NT, ND Skin:  no rash or induration seen on limited exam Musculoskeletal:  grossly normal tone BUE/BLE, good ROM, no bony abnormality Psychiatric:  grossly normal to mildly hyperactive mood and affect, speech fluent and appropriate, AOx3 Neurologic:  CN 2-12 grossly intact, moves all extremities in coordinated fashion  Data Reviewed: I have reviewed the patient's lab results since admission.  Pertinent labs for today include:   Stable labs on 12/2 HIV negative UDS + [place]    Condition at discharge: improving  The results of significant diagnostics from this hospitalization (including imaging, microbiology, ancillary and laboratory) are listed below for reference.   Imaging Studies: No results found.  Microbiology: Results for orders placed or performed during the hospital encounter of 04/25/15  Urine culture     Status: None   Collection Time: 04/25/15  5:09 PM   Specimen: Urine, Random  Result Value Ref Range Status   Specimen Description URINE, RANDOM  Final   Special Requests NONE  Final   Culture MULTIPLE SPECIES PRESENT, SUGGEST RECOLLECTION  Final   Report Status 04/27/2015 FINAL  Final    Labs: CBC: Recent Labs  Lab 07/13/24 1823 07/14/24 0523  WBC 13.6* 8.4  HGB 12.1 10.4*  HCT 35.9* 31.2*  MCV 73.6* 73.8*  PLT 316 255   Basic Metabolic Panel: Recent Labs  Lab 07/13/24 1823 07/14/24 0523  NA 135 135  K 4.2 3.5  CL 99 104  CO2 18* 21*  GLUCOSE 73 72  BUN 26* 16  CREATININE 1.34* 0.98  CALCIUM 9.7 8.5*  MG  --  2.2  PHOS  --  3.0   Liver Function Tests: Recent Labs  Lab 07/13/24 1823 07/14/24 0523  AST 40 30  ALT  21 17  ALKPHOS 66 50  BILITOT 0.6 0.5  PROT 8.0 5.9*  ALBUMIN 4.7 3.6   CBG: No results for input(s): GLUCAP in the last 168 hours.  Discharge time spent: greater than 30 minutes.  Signed: Delon Herald, MD [facility] 07/15/2024

## 2024-07-16 LAB — URINE CULTURE: Culture: NO GROWTH

## 2024-09-03 ENCOUNTER — Encounter (HOSPITAL_COMMUNITY): Payer: Self-pay | Admitting: Licensed Clinical Social Worker
